# Patient Record
Sex: Female | Born: 1960 | Race: Black or African American | Hispanic: No | Marital: Married | Smoking: Never smoker
Health system: Southern US, Community
[De-identification: ages and names within clinical notes are randomized; demographics above are authoritative.]

## PROBLEM LIST (undated history)

## (undated) DIAGNOSIS — Z90710 Acquired absence of both cervix and uterus: Secondary | ICD-10-CM

## (undated) HISTORY — DX: Acquired absence of both cervix and uterus: Z90.710

---

## 1989-11-30 DIAGNOSIS — Z90721 Acquired absence of ovaries, unilateral: Secondary | ICD-10-CM

## 1991-11-24 HISTORY — PX: ABDOMINAL HYSTERECTOMY: SHX81

## 1991-11-30 DIAGNOSIS — Z90710 Acquired absence of both cervix and uterus: Secondary | ICD-10-CM

## 1991-11-30 HISTORY — DX: Acquired absence of both cervix and uterus: Z90.710

## 2017-11-26 ENCOUNTER — Encounter: Payer: Self-pay | Admitting: Family Medicine

## 2017-11-29 ENCOUNTER — Encounter: Payer: Self-pay | Admitting: Family Medicine

## 2017-11-30 ENCOUNTER — Encounter: Payer: Self-pay | Admitting: Family Medicine

## 2017-11-30 ENCOUNTER — Other Ambulatory Visit: Payer: Self-pay

## 2017-11-30 ENCOUNTER — Ambulatory Visit: Payer: Self-pay | Admitting: Family Medicine

## 2017-11-30 VITALS — BP 112/58 | HR 54 | Temp 97.7°F | Ht 66.0 in | Wt 147.0 lb

## 2017-11-30 DIAGNOSIS — Z7689 Persons encountering health services in other specified circumstances: Secondary | ICD-10-CM

## 2017-11-30 DIAGNOSIS — Z1159 Encounter for screening for other viral diseases: Secondary | ICD-10-CM

## 2017-11-30 DIAGNOSIS — Z23 Encounter for immunization: Secondary | ICD-10-CM

## 2017-11-30 DIAGNOSIS — Z1231 Encounter for screening mammogram for malignant neoplasm of breast: Secondary | ICD-10-CM

## 2017-11-30 DIAGNOSIS — R010 Benign and innocent cardiac murmurs: Secondary | ICD-10-CM | POA: Insufficient documentation

## 2017-11-30 DIAGNOSIS — Z1239 Encounter for other screening for malignant neoplasm of breast: Secondary | ICD-10-CM

## 2017-11-30 DIAGNOSIS — Z13220 Encounter for screening for lipoid disorders: Secondary | ICD-10-CM

## 2017-11-30 DIAGNOSIS — Z114 Encounter for screening for human immunodeficiency virus [HIV]: Secondary | ICD-10-CM

## 2017-11-30 NOTE — Progress Notes (Signed)
Subjective:     Patient ID: Madison Booker, female   DOB: Feb 05, 1961, 57 y.o.   MRN: [id number]  HPI New Patient: Here to establish care. She endorsed hx of functional murmur diagnosed when she was around 57 years old. She had not had issues with her heart since then. She also endorsed gas from her vagina whenever she stands from a sitting position on and off. She denies pain, no GI symptoms, no vaginal bleed. She had abdominal hysterectomy with cervix removed in 1993 and had not needed to do PAP since then. No other concern today.  No current outpatient medications on file prior to visit.   No current facility-administered medications on file prior to visit.    Past Medical History:  Diagnosis Date  . H/O abdominal hysterectomy 11/30/1991   Vitals:   11/30/17 0836  BP: (!) 112/58  Pulse: (!) 54  Temp: 97.7 F (36.5 C)  TempSrc: Oral  SpO2: 97%  Weight: 147 lb (66.7 kg)  Height: 5\' 6"  (1.676 m)     Review of Systems  Respiratory: Negative.   Cardiovascular: Negative.   Gastrointestinal: Negative.   Genitourinary:       Passing gas through her vagina  All other systems reviewed and are negative.      Objective:   Physical Exam  Constitutional: She is oriented to person, place, and time. She appears well-developed. No distress.  Cardiovascular: Normal rate, regular rhythm and normal heart sounds.  No murmur heard. Pulmonary/Chest: Effort normal and breath sounds normal. No respiratory distress. She has no wheezes.  Abdominal: Soft. Bowel sounds are normal. She exhibits no distension and no mass. There is no tenderness.  Genitourinary:  Genitourinary Comments: Deferred  Musculoskeletal: Normal range of motion. She exhibits no edema.  Neurological: She is alert and oriented to person, place, and time.  Psychiatric: She has a normal mood and affect. Her behavior is normal. Judgment and thought content normal.  Nursing note and vitals reviewed.      Assessment:      Establish care Functional murmur GU gassing    Plan:     Physical exam benign. FLP, BMET, HIV and Hep C screening test completed today. Mammogram ordered. Flu shot and Tdap given today. Colonoscopy offered but she stated she is up to date and not due till 2021. She declined GI referral.  Functional murmur. Normal Cardiovascular exam. She is asymptomatic. We will monitor.  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 This is benign. Return for GU and wellness exam in few weeks.  F/U as needed

## 2017-11-30 NOTE — Patient Instructions (Signed)
Hepatitis C Test Hepatitis C is a liver infection caused by the hepatitis C virus (HCV). Hepatitis C is usually diagnosed with two blood tests. One test checks for antibodies to the virus in your blood. Antibodies are proteins that your body makes to fight infections. If you have antibodies to HCV, it means you have been infected. It does not mean you are still infected. An HCV infection may not cause any symptoms, and you may be able to get rid of the virus without treatment. If you have antibodies to HCV, you will need to have another test to find out if you are still infected. This test is called the HCV RNA qualitative test. It looks for genetic material from HCV in your blood. If you are diagnosed with an active HCV infection, you may also have an HCV RNA quantitative test to measure the amount of virus in your blood (viral load). Your health care provider may repeat this test in order to monitor you during treatment. You may also have an HCV genotype test to identify the kind (genotype) of virus you have. This helps your health care provider determine the best treatment for you. You may be tested if you show symptoms of HCV infection. It is important to be tested because hepatitis C can lead to serious liver damage if not treated. All HCV tests require a blood sample taken from a vein in your hand or arm. What do the results mean? It is your responsibility to obtain your test results. Ask the lab or department performing the test when and how you will get your results. Talk to your health care provider if you have any questions about your test results. Results of both the HCV antibody test and the HCV RNA test will be either positive or negative. Meaning of Negative Test Results  If your HCV antibody test is negative, it may mean that you have not been infected with HCV. However, it can take a few months for the antibodies to build up in your blood. If it is possible you may have been infected  recently, you may need to repeat the test.  If your HCV RNA qualitative test is negative, this means it is unlikely you have an active HCV infection. Meaning of Positive Test Results  If your HCV antibody test is positive, it is likely that you are infected or have been infected with HCV.  If your HCV RNA qualitative test is also positive, it confirms you have an active HCV infection. Talk with your health care provider to discuss your results, treatment options, and if necessary, the need for more tests. Talk with your health care provider if you have any questions about your results. This information is not intended to replace advice given to you by your health care provider. Make sure you discuss any questions you have with your health care provider. Document Released: 12/12/2004 Document Revised: 07/15/2016 Document Reviewed: 02/12/2014 Elsevier Interactive Patient Education  [employer]2018 Elsevier Inc.

## 2017-12-01 ENCOUNTER — Telehealth: Payer: Self-pay | Admitting: Family Medicine

## 2017-12-01 LAB — BASIC METABOLIC PANEL
BUN/Creatinine Ratio: 19 (ref 9–23)
BUN: 13 mg/dL (ref 6–24)
CALCIUM: 9.3 mg/dL (ref 8.7–10.2)
CHLORIDE: 104 mmol/L (ref 96–106)
CO2: 27 mmol/L (ref 20–29)
Creatinine, Ser: 0.67 mg/dL (ref 0.57–1.00)
GFR calc non Af Amer: 99 mL/min/{1.73_m2} (ref >59)
GFR, EST AFRICAN AMERICAN: 114 mL/min/{1.73_m2} (ref >59)
Glucose: 77 mg/dL (ref 65–99)
Potassium: 4.1 mmol/L (ref 3.5–5.2)
Sodium: 143 mmol/L (ref 134–144)

## 2017-12-01 LAB — LIPID PANEL
CHOL/HDL RATIO: 2.9 ratio (ref 0.0–4.4)
Cholesterol, Total: 215 mg/dL — ABNORMAL HIGH (ref 100–199)
HDL: 73 mg/dL (ref >39)
LDL Calculated: 132 mg/dL — ABNORMAL HIGH (ref 0–99)
TRIGLYCERIDES: 49 mg/dL (ref 0–149)
VLDL Cholesterol Cal: 10 mg/dL (ref 5–40)

## 2017-12-01 LAB — HIV ANTIBODY (ROUTINE TESTING W REFLEX): HIV Screen 4th Generation wRfx: NONREACTIVE

## 2017-12-01 LAB — HEPATITIS C ANTIBODY: Hep C Virus Ab: <0.1 s/co ratio (ref 0.0–0.9)

## 2017-12-01 NOTE — Telephone Encounter (Signed)
I called patient back and discussed all result with her. I discussed lifestyle modification  with her regarding her lipid panel. All questions were answered.

## 2017-12-01 NOTE — Telephone Encounter (Signed)
HIPPA compliant call back message left.Result message not left on her phone.    Note: All results are normal except for lipid panel.  Her 10 years ASCVD risk is 1.7%. She does not need to be on Statin. Diet and exercise modification recommended.

## 2017-12-27 ENCOUNTER — Ambulatory Visit
Admission: RE | Admit: 2017-12-27 | Discharge: 2017-12-27 | Disposition: A | Discharge status: Home / Self Care | Payer: BLUE CROSS/BLUE SHIELD | Source: Ambulatory Visit | Attending: Family Medicine | Admitting: Family Medicine

## 2017-12-27 DIAGNOSIS — Z1239 Encounter for other screening for malignant neoplasm of breast: Secondary | ICD-10-CM

## 2017-12-27 IMAGING — MG DIGITAL SCREENING BILATERAL MAMMOGRAM WITH CAD
4 series · 4 of 4 positions shown · non-contrast
Comparison: No priors available.

CLINICAL DATA: Screening.

EXAM:
DIGITAL SCREENING BILATERAL MAMMOGRAM WITH CAD

[L CC]
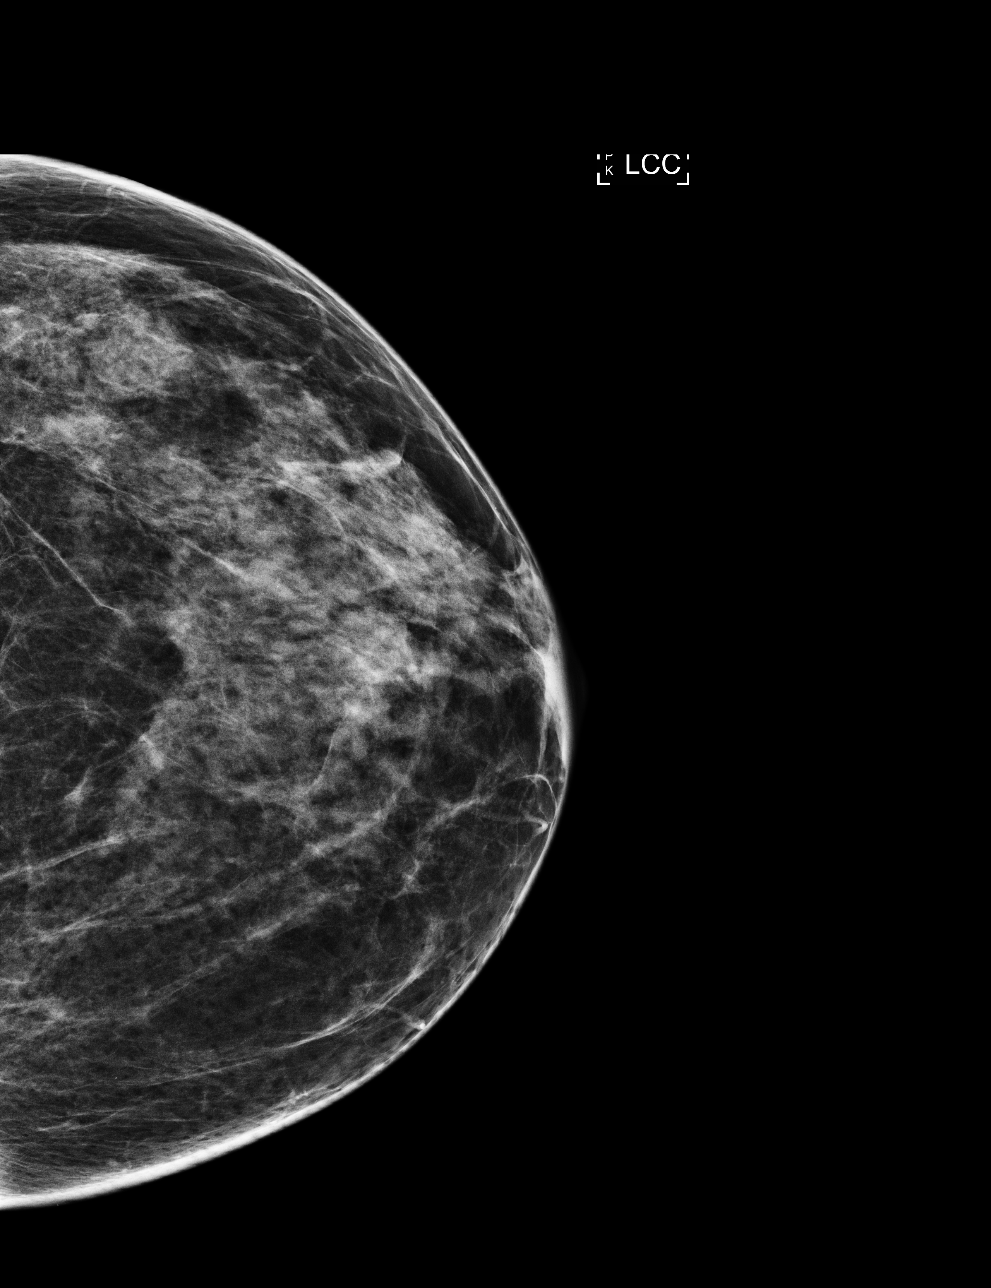

[L MLO]
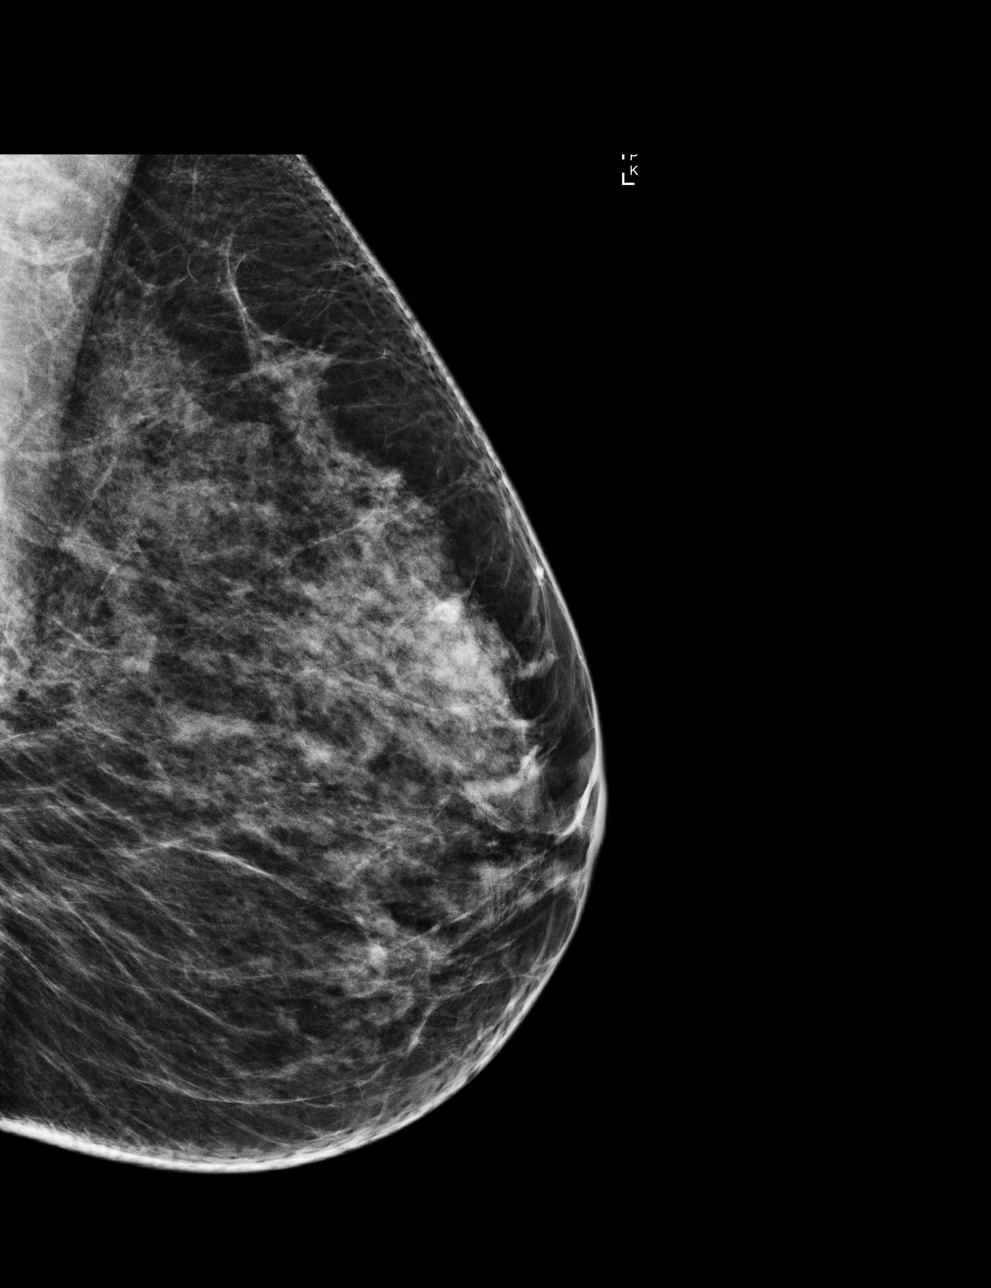

[R CC]
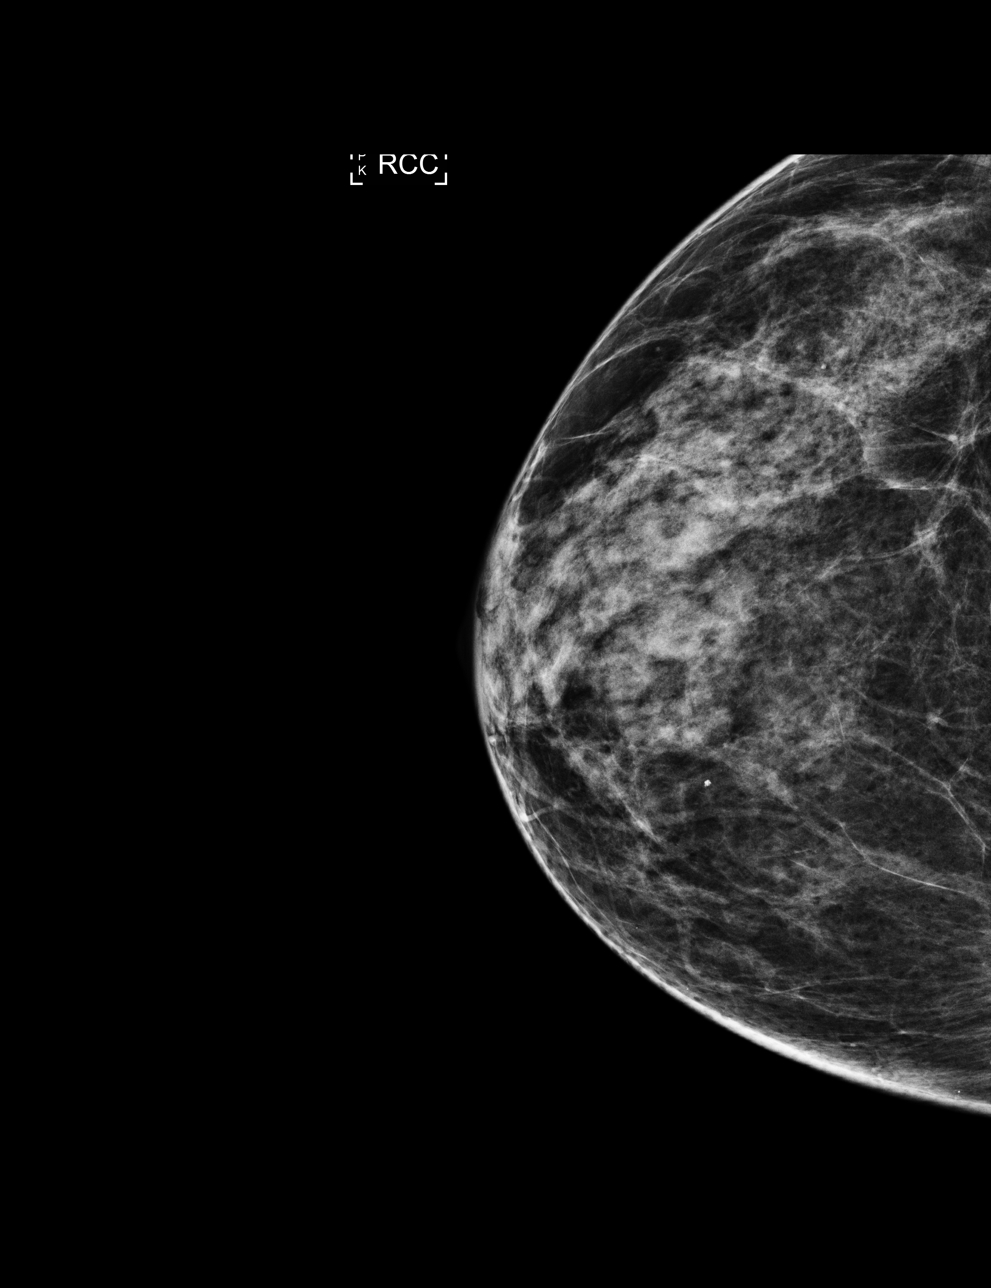

[R MLO]
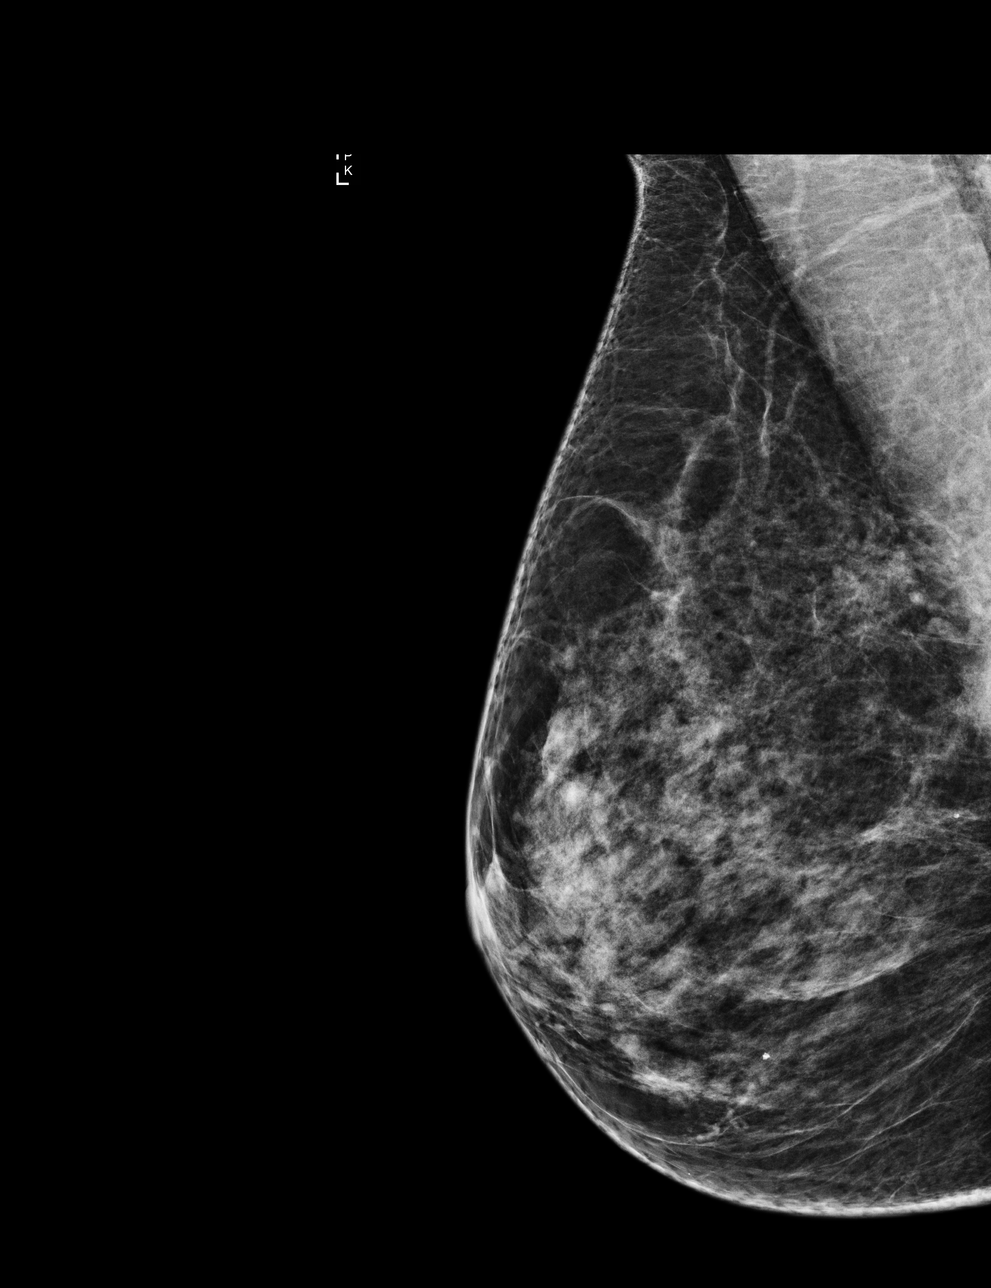

[4 of 4 positions shown; findings below may reference images not displayed]

ACR Breast Density Category c: The breast tissue is heterogeneously
dense, which may obscure small masses
FINDINGS: There are no findings suspicious for malignancy. Images were
processed with CAD.
IMPRESSION: No mammographic evidence of malignancy. A result letter of this
screening mammogram will be mailed directly to the patient.

RECOMMENDATION:
Screening mammogram in one year. (Code:[MJ])

BI-RADS CATEGORY  1: Negative.

## 2020-10-01 ENCOUNTER — Encounter: Payer: Self-pay | Admitting: Family Medicine

## 2020-10-04 LAB — FECAL OCCULT BLOOD, GUAIAC: Fecal Occult Blood: NEGATIVE

## 2020-10-25 ENCOUNTER — Encounter: Payer: Self-pay | Admitting: Family Medicine

## 2020-10-25 ENCOUNTER — Other Ambulatory Visit: Payer: Self-pay

## 2020-10-25 ENCOUNTER — Ambulatory Visit: Payer: No Typology Code available for payment source | Admitting: Family Medicine

## 2020-10-25 ENCOUNTER — Ambulatory Visit
Admission: RE | Admit: 2020-10-25 | Discharge: 2020-10-25 | Disposition: A | Discharge status: Home / Self Care | Payer: No Typology Code available for payment source | Source: Ambulatory Visit | Attending: Family Medicine | Admitting: Family Medicine

## 2020-10-25 VITALS — BP 115/60 | HR 89 | Ht 66.0 in | Wt 142.4 lb

## 2020-10-25 DIAGNOSIS — E785 Hyperlipidemia, unspecified: Secondary | ICD-10-CM

## 2020-10-25 DIAGNOSIS — Z0000 Encounter for general adult medical examination without abnormal findings: Secondary | ICD-10-CM | POA: Diagnosis not present

## 2020-10-25 DIAGNOSIS — E1169 Type 2 diabetes mellitus with other specified complication: Secondary | ICD-10-CM | POA: Diagnosis not present

## 2020-10-25 DIAGNOSIS — Z1231 Encounter for screening mammogram for malignant neoplasm of breast: Secondary | ICD-10-CM | POA: Diagnosis not present

## 2020-10-25 DIAGNOSIS — E2839 Other primary ovarian failure: Secondary | ICD-10-CM | POA: Diagnosis not present

## 2020-10-25 NOTE — Progress Notes (Signed)
Subjective:     Madison Booker is a 59 y.o. female and is here for a comprehensive physical exam. The patient reports no problems.  Social History   Socioeconomic History  . Marital status: Unknown    Spouse name: Not on file  . Number of children: Not on file  . Years of education: Not on file  . Highest education level: Not on file  Occupational History  . Not on file  Tobacco Use  . Smoking status: Never Smoker  . Smokeless tobacco: Never Used  Vaping Use  . Vaping Use: Never used  Substance and Sexual Activity  . Alcohol use: No  . Drug use: No  . Sexual activity: Not on file  Other Topics Concern  . Not on file  Social History Narrative  . Not on file   Social Determinants of Health   Financial Resource Strain:   . Difficulty of Paying Living Expenses: Not on file  Food Insecurity:   . Worried About Programme researcher, broadcasting/film/video in the Last Year: Not on file  . Ran Out of Food in the Last Year: Not on file  Transportation Needs:   . Lack of Transportation (Medical): Not on file  . Lack of Transportation (Non-Medical): Not on file  Physical Activity:   . Days of Exercise per Week: Not on file  . Minutes of Exercise per Session: Not on file  Stress:   . Feeling of Stress : Not on file  Social Connections:   . Frequency of Communication with Friends and Family: Not on file  . Frequency of Social Gatherings with Friends and Family: Not on file  . Attends Religious Services: Not on file  . Active Member of Clubs or Organizations: Not on file  . Attends Banker Meetings: Not on file  . Marital Status: Not on file  Intimate Partner Violence:   . Fear of Current or Ex-Partner: Not on file  . Emotionally Abused: Not on file  . Physically Abused: Not on file  . Sexually Abused: Not on file   Health Maintenance  Topic Date Due  . DEXA SCAN  Never done  . MAMMOGRAM  12/27/2018  . COLONOSCOPY  12/01/2019  . TETANUS/TDAP  12/01/2027  . INFLUENZA VACCINE   Completed  . Hepatitis C Screening  Completed  . HIV Screening  Completed    The following portions of the patient's history were reviewed and updated as appropriate: allergies, current medications, past family history, past medical history, past social history, past surgical history and problem list.  Review of Systems Pertinent items noted in HPI and remainder of comprehensive ROS otherwise negative.   Objective:    BP 115/60   Pulse 89   Ht 5\' 6"  (1.676 m)   Wt 142 lb 6.4 oz (64.6 kg)   SpO2 97%   BMI 22.98 kg/m  General appearance: alert and cooperative Head: Normocephalic, without obvious abnormality, atraumatic Eyes: conjunctivae/corneas clear. PERRL, EOM's intact. Fundi benign. Ears: normal TM's and external ear canals both ears Throat: lips, mucosa, and tongue normal; teeth and gums normal Lungs: clear to auscultation bilaterally Heart: regular rate and rhythm, S1, S2 normal, no murmur, click, rub or gallop Abdomen: soft, non-tender; bowel sounds normal; no masses,  no organomegaly Extremities: extremities normal, atraumatic, no cyanosis or edema Skin: Skin color, texture, turgor normal. No rashes or lesions Lymph nodes: Cervical, supraclavicular, and axillary nodes normal. Neurologic: Alert and oriented X 3, normal strength and tone. Normal symmetric reflexes. Normal  coordination and gait    Assessment:    Healthy female exam.   Plan:  Normal well exam. She is up to date with her COVID-19 and Flu shot. See media for COVID-19 vaccination. She also recently got her booster shot. Colon cancer screening discussed. She had FOBT test done in Nov. She brought report in later today and I have updated her record. She will like to defer colonoscopy for now. Bmte and FLP checked today. Mammogram and Dexa discussed. She will schedule her appointment. Order placed.   See After Visit Summary for Counseling Recommendations

## 2020-10-25 NOTE — Patient Instructions (Signed)
Mammogram °A mammogram is an X-ray of the breasts that is done to check for changes that are not normal. This test can screen for and find any changes that may suggest breast cancer. Mammograms are regularly done on women. A man may have a mammogram if he has a lump or swelling in his breast. This test can also help to find other changes and variations in the breast. °Tell a doctor: °· About any allergies you have. °· If you have breast implants. °· If you have had previous breast disease, biopsy, or surgery. °· If you are breastfeeding. °· If you are younger than age 25. °· If you have a family history of breast cancer. °· Whether you are pregnant or may be pregnant. °What are the risks? °Generally, this is a safe procedure. However, problems may occur, including: °· Exposure to radiation. Radiation levels are very low with this test. °· The results being misinterpreted. °· The need for further tests. °· The inability of the mammogram to detect certain cancers. °What happens before the procedure? °· Have this test done about 1-2 weeks after your period. This is usually when your breasts are the least tender. °· If you are visiting a new doctor or clinic, send any past mammogram images to your new doctor's office. °· Wash your breasts and under your arms the day of the test. °· Do not use deodorants, perfumes, lotions, or powders on the day of the test. °· Take off any jewelry from your neck. °· Wear clothes that you can change into and out of easily. °What happens during the procedure? ° °· You will undress from the waist up. You will put on a gown. °· You will stand in front of the X-ray machine. °· Each breast will be placed between two plastic or glass plates. The plates will press down on your breast for a few seconds. Try to stay as relaxed as possible. This does not cause any harm to your breasts. Any discomfort you feel will be very brief. °· X-rays will be taken from different angles of each breast. °The  procedure may vary among doctors and hospitals. °What happens after the procedure? °· The mammogram will be read by a specialist (radiologist). °· You may need to do certain parts of the test again. This depends on the quality of the images. °· Ask when your test results will be ready. Make sure you get your test results. °· You may go back to your normal activities. °Summary °· A mammogram is a low energy X-ray of the breasts that is done to check for abnormal changes. A man may have this test if he has a lump or swelling in his breast. °· Before the procedure, tell your doctor about any breast problems that you have had in the past. °· Have this test done about 1-2 weeks after your period. °· For the test, each breast will be placed between two plastic or glass plates. The plates will press down on your breast for a few seconds. °· The mammogram will be read by a specialist (radiologist). Ask when your test results will be ready. Make sure you get your test results. °This information is not intended to replace advice given to you by your health care provider. Make sure you discuss any questions you have with your health care provider. °Document Revised: 06/30/2018 Document Reviewed: 06/30/2018 °Elsevier Patient Education © 2020 Elsevier Inc. ° °

## 2020-10-26 LAB — LIPID PANEL
Chol/HDL Ratio: 2.4 ratio (ref 0.0–4.4)
Cholesterol, Total: 230 mg/dL — ABNORMAL HIGH (ref 100–199)
HDL: 94 mg/dL (ref >39)
LDL Chol Calc (NIH): 129 mg/dL — ABNORMAL HIGH (ref 0–99)
Triglycerides: 39 mg/dL (ref 0–149)
VLDL Cholesterol Cal: 7 mg/dL (ref 5–40)

## 2020-10-26 LAB — BASIC METABOLIC PANEL
BUN/Creatinine Ratio: 23 (ref 9–23)
BUN: 14 mg/dL (ref 6–24)
CO2: 27 mmol/L (ref 20–29)
Calcium: 9.4 mg/dL (ref 8.7–10.2)
Chloride: 103 mmol/L (ref 96–106)
Creatinine, Ser: 0.62 mg/dL (ref 0.57–1.00)
GFR calc Af Amer: 116 mL/min/{1.73_m2} (ref >59)
GFR calc non Af Amer: 100 mL/min/{1.73_m2} (ref >59)
Glucose: 87 mg/dL (ref 65–99)
Potassium: 4 mmol/L (ref 3.5–5.2)
Sodium: 142 mmol/L (ref 134–144)

## 2020-10-28 ENCOUNTER — Telehealth: Payer: Self-pay | Admitting: Family Medicine

## 2020-10-28 NOTE — Telephone Encounter (Signed)
HIPAA compliant callback message left.   Please advise her when she calls:  Lipid profile elevated from her previous. However, her risk of cardiovascular event in the next 10 years is low (2.2%).  Hence, I will recommend lifestyle modification, I.e reduce fat, and carbs, increase fibers, fruits and vege. Increase exercise as tolerated.  F/U in 1 yr for recheck.  Other tests BMP are normal.

## 2020-10-28 NOTE — Telephone Encounter (Signed)
Patient returns call to nurse line for test results. Provided below information. Patient has no additional questions.   Veronda Prude, RN

## 2021-03-24 ENCOUNTER — Other Ambulatory Visit: Payer: No Typology Code available for payment source

## 2021-09-26 ENCOUNTER — Other Ambulatory Visit: Payer: Self-pay | Admitting: Family Medicine

## 2021-09-26 DIAGNOSIS — Z1231 Encounter for screening mammogram for malignant neoplasm of breast: Secondary | ICD-10-CM

## 2021-10-31 ENCOUNTER — Ambulatory Visit
Admission: RE | Admit: 2021-10-31 | Discharge: 2021-10-31 | Disposition: A | Discharge status: Home / Self Care | Payer: 59 | Source: Ambulatory Visit | Attending: Family Medicine | Admitting: Family Medicine

## 2021-10-31 DIAGNOSIS — Z1231 Encounter for screening mammogram for malignant neoplasm of breast: Secondary | ICD-10-CM

## 2021-10-31 IMAGING — MG MM DIGITAL SCREENING BILAT W/ TOMO AND CAD
6 of 10 series · 6 of 30 positions shown · non-contrast
Comparison: Previous exam(s).

CLINICAL DATA: Screening.

EXAM:
DIGITAL SCREENING BILATERAL MAMMOGRAM WITH TOMOSYNTHESIS AND CAD
TECHNIQUE: Bilateral screening digital craniocaudal and mediolateral oblique
mammograms were obtained. Bilateral screening digital breast
tomosynthesis was performed. The images were evaluated with
computer-aided detection.

[L CC synth-2D (1 of 2)]
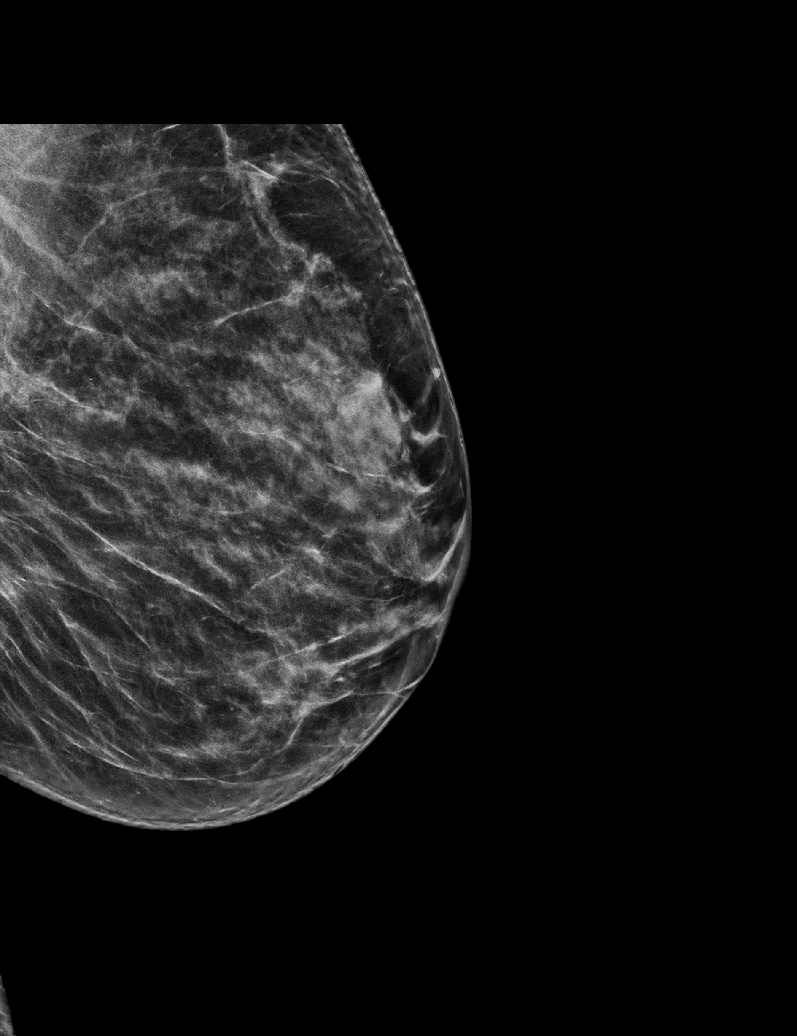

[L CC synth-2D (2 of 2)]
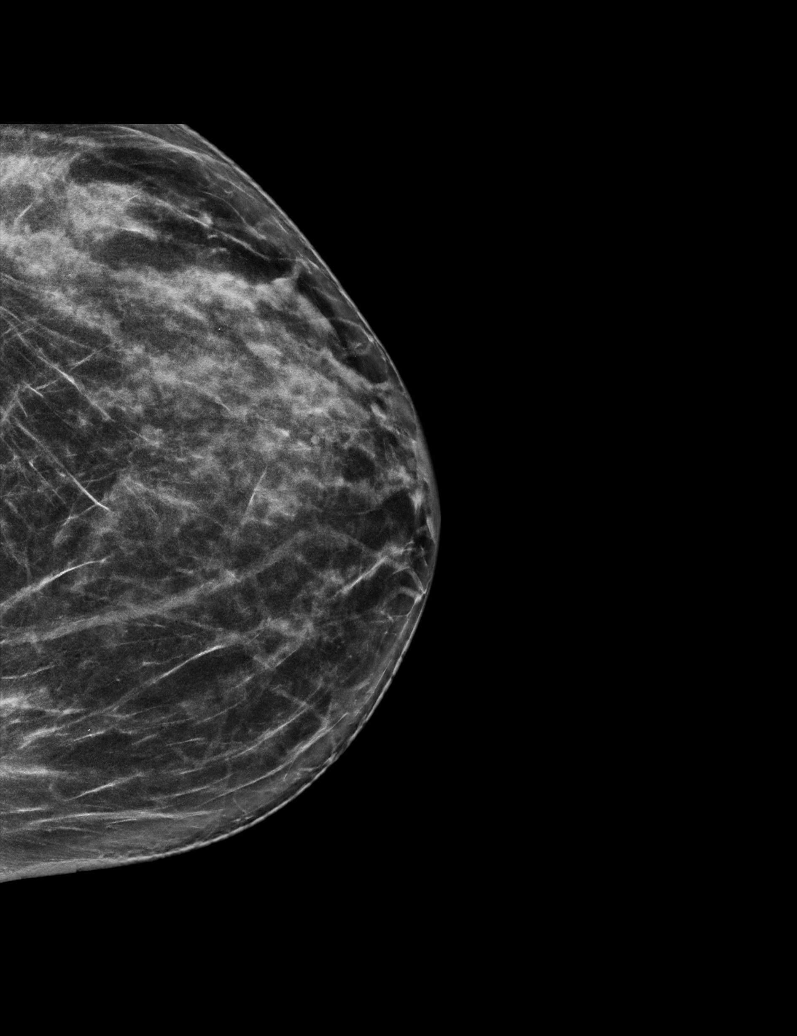

[L MLO synth-2D]
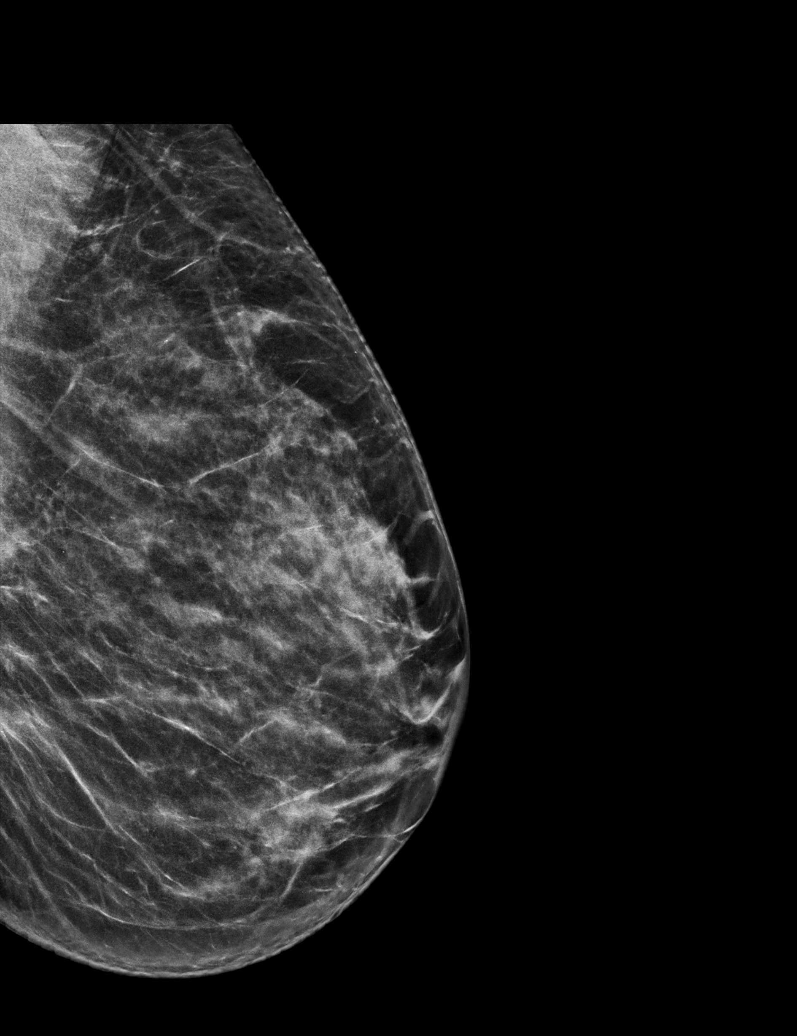

[R CC synth-2D]
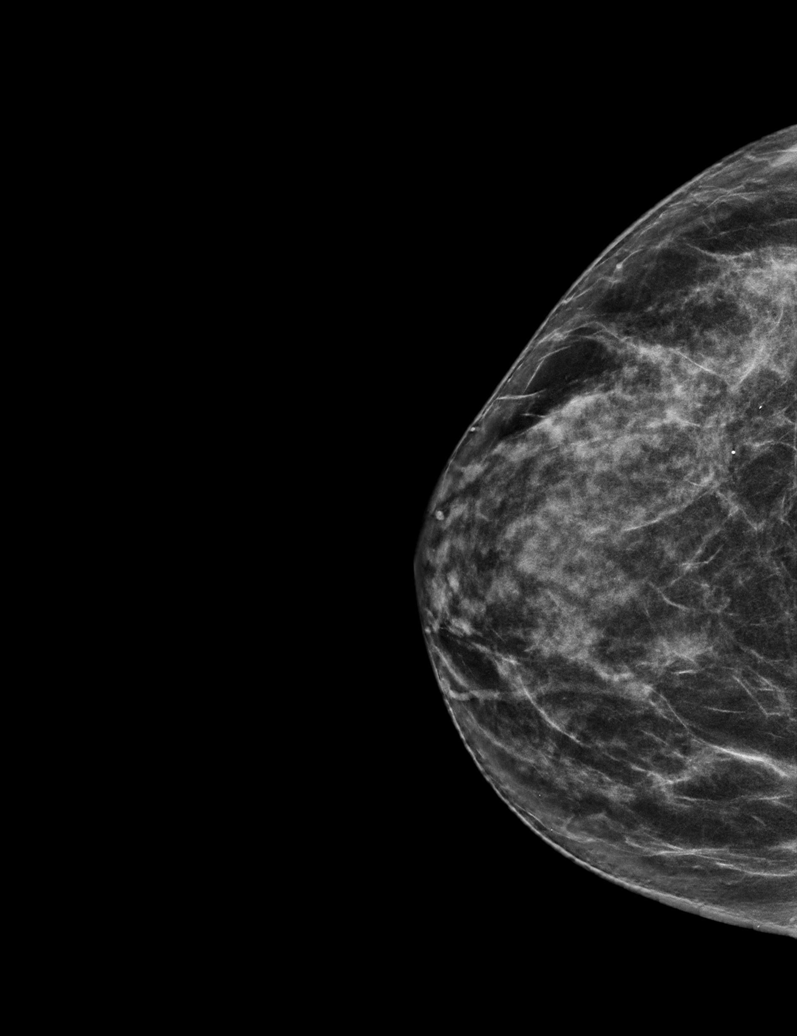

[R MLO synth-2D]
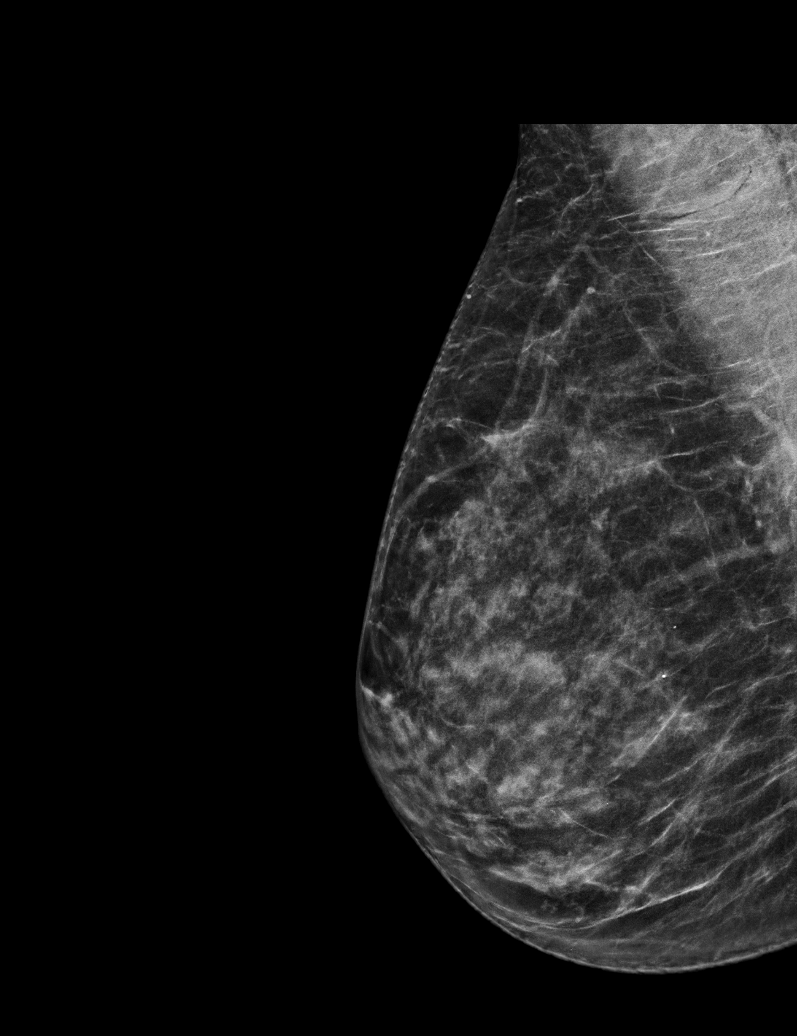

[L MLO tomo · tomo slice 31/62.0]
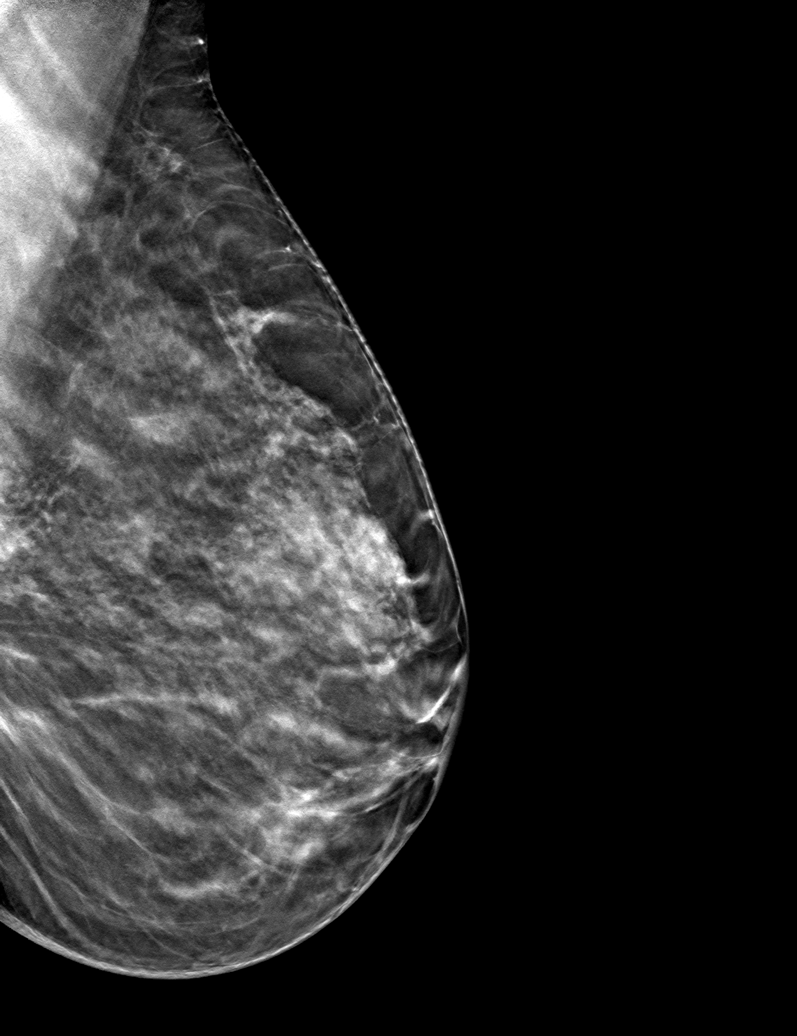

[6 of 30 positions shown; findings below may reference images not displayed]

ACR Breast Density Category c: The breast tissue is heterogeneously
dense, which may obscure small masses.
FINDINGS: There are no findings suspicious for malignancy.
IMPRESSION: No mammographic evidence of malignancy. A result letter of this
screening mammogram will be mailed directly to the patient.

RECOMMENDATION:
Screening mammogram in one year. (Code:[V2])

BI-RADS CATEGORY  1: Negative.

## 2021-11-03 ENCOUNTER — Ambulatory Visit: Payer: No Typology Code available for payment source

## 2021-11-05 NOTE — Telephone Encounter (Signed)
Attempted to reach pt. No answer. LVM to call the office to make appt for physical exam. Will try later today. Aquilla Solian, CMA

## 2021-11-10 ENCOUNTER — Encounter: Payer: Self-pay | Admitting: Family Medicine

## 2021-11-10 DIAGNOSIS — E785 Hyperlipidemia, unspecified: Secondary | ICD-10-CM | POA: Insufficient documentation

## 2021-11-11 ENCOUNTER — Encounter: Payer: Self-pay | Admitting: Family Medicine

## 2021-11-11 ENCOUNTER — Ambulatory Visit (INDEPENDENT_AMBULATORY_CARE_PROVIDER_SITE_OTHER): Payer: 59 | Admitting: Family Medicine

## 2021-11-11 ENCOUNTER — Other Ambulatory Visit: Payer: Self-pay

## 2021-11-11 VITALS — BP 106/52 | HR 60 | Ht 61.0 in | Wt 151.5 lb

## 2021-11-11 DIAGNOSIS — Z0000 Encounter for general adult medical examination without abnormal findings: Secondary | ICD-10-CM

## 2021-11-11 DIAGNOSIS — E785 Hyperlipidemia, unspecified: Secondary | ICD-10-CM | POA: Diagnosis not present

## 2021-11-11 DIAGNOSIS — Z1211 Encounter for screening for malignant neoplasm of colon: Secondary | ICD-10-CM | POA: Diagnosis not present

## 2021-11-11 MED ORDER — ZOSTER VAC RECOMB ADJUVANTED 50 MCG/0.5ML IM SUSR: 0.5000 mL | Freq: Once | INTRAMUSCULAR | 1 refills | Status: AC

## 2021-11-11 NOTE — Addendum Note (Signed)
Addended by: Janit Pagan T on: 11/11/2021 02:20 PM   Modules accepted: Orders

## 2021-11-11 NOTE — Patient Instructions (Signed)
Colorectal Cancer Screening °Colorectal cancer screening is a group of tests that are used to check for colorectal cancer before symptoms develop. Colorectal refers to the colon and rectum. The colon and rectum are located at the end of the digestive tract and carry stool (feces) out of the body. °Who should have screening? °All adults who are 45-59 years old should have screening. Your health care provider may recommend screening before age 45. You will have tests every 1-10 years, depending on your results and the type of screening test. Screening recommendations for adults who are 76-85 years old vary depending on a person's health. People older than age 85 should no longer get colorectal cancer screening. °You may have screening tests starting before age 45, or more often than other people, if you have any of these risk factors: °A personal or family history of colorectal cancer or abnormal growths known as polyps in your colon. °Inflammatory bowel disease, such as ulcerative colitis or Crohn's disease. °A history of having radiation treatment to the abdomen or the area between the hip bones (pelvic area) for cancer. °A type of genetic syndrome that is passed from parent to child (hereditary), such as: °Lynch syndrome. °Familial adenomatous polyposis. °Turcot syndrome. °Peutz-Jeghers syndrome. °MUTYH-associated polyposis (MAP). °A personal history of diabetes. °Types of tests °There are several types of colorectal screening tests. You may have one or more of the following: °Guaiac-based fecal occult blood testing. For this test, a stool sample is checked for hidden (occult) blood, which could be a sign of colorectal cancer. °Fecal immunochemical test (FIT). For this test, a stool sample is checked for blood, which could be a sign of colorectal cancer. °Stool DNA test. For this test, a stool sample is checked for blood and changes in DNA that could lead to colorectal cancer. °Sigmoidoscopy. During this test, a  thin, flexible tube with a camera on the end, called a sigmoidoscope, is used to examine the rectum and the lower colon. °Colonoscopy. During this test, a long, flexible tube with a camera on the end, called a colonoscope, is used to examine the entire colon and rectum. Also, sometimes a tissue sample is taken to be looked at under a microscope (biopsy) or small polyps are removed during this test. °Virtual colonoscopy. Instead of a colonoscope, this type of colonoscopy uses a CT scan to take pictures of the colon and rectum. A CT scan is a type of X-ray that is made using computers. °What are the benefits of screening? °Screening reduces your risk for colorectal cancer and can help identify cancer at an early stage, when the cancer can be removed or treated more easily. It is common for polyps to form in the lining of the colon, especially as you age. These polyps may be cancerous or become cancerous over time. Screening can identify these polyps. °What are the risks of screening? °Generally, these are safe tests. However, problems may occur, including: °The need for more tests to confirm results from a stool sample test. Stool sample tests have fewer risks than other types of screening tests. °Being exposed to low levels of radiation, if you had a test involving X-rays. This may slightly increase your cancer risk. The benefit of detecting cancer outweighs the slight increase in risk. °Bleeding, damage to the intestine, or infection caused by a sigmoidoscopy or colonoscopy. °A reaction to medicines given during a sigmoidoscopy or colonoscopy. °Talk with your health care provider to understand your risk for colorectal cancer and to make a screening   plan that is right for you. Questions to ask your health care provider When should I start colorectal cancer screening? What is my risk for colorectal cancer? How often do I need screening? Which screening tests do I need? How do I get my test results? What do my  results mean? Where to find more information Learn more about colorectal cancer screening from: The American Cancer Society: cancer.org National Cancer Institute: cancer.gov Summary Colorectal cancer screening is a group of tests used to check for colorectal cancer before symptoms develop. All adults who are 48-21 years old should have screening. Your health care provider may recommend screening before age 97. You may have screening tests starting before age 78, or more often than other people, if you have certain risk factors. Screening reduces your risk for colorectal cancer and can help identify cancer at an early stage, when the cancer can be removed or treated more easily. Talk with your health care provider to understand your risk for colorectal cancer and to make a screening plan that is right for you. This information is not intended to replace advice given to you by your health care provider. Make sure you discuss any questions you have with your health care provider. Document Revised: 02/28/2020 Document Reviewed: 02/28/2020 Elsevier Patient Education  2022 [employer].

## 2021-11-11 NOTE — Progress Notes (Signed)
Subjective:     Madison Booker is a 60 y.o. female and is here for a comprehensive physical exam. The patient reports no problems. She endorses COVID-19 and flu vaccination in Oct 2022 and is here with her covid19 card.  Social History   Socioeconomic History   Marital status: Unknown    Spouse name: Not on file   Number of children: Not on file   Years of education: Not on file   Highest education level: Not on file  Occupational History   Not on file  Tobacco Use   Smoking status: Never   Smokeless tobacco: Never  Vaping Use   Vaping Use: Never used  Substance and Sexual Activity   Alcohol use: No   Drug use: No   Sexual activity: Not on file  Other Topics Concern   Not on file  Social History Narrative   Not on file   Social Determinants of Health   Financial Resource Strain: Not on file  Food Insecurity: Not on file  Transportation Needs: Not on file  Physical Activity: Not on file  Stress: Not on file  Social Connections: Not on file  Intimate Partner Violence: Not on file   Health Maintenance  Topic Date Due   DEXA SCAN  Never done   Pneumococcal Vaccine 18-69 Years old (1 - PCV) Never done   Zoster Vaccines- Shingrix (1 of 2) Never done   COLONOSCOPY (Pts 45-67yrs Insurance coverage will need to be confirmed)  12/01/2019   COVID-19 Vaccine (3 - Booster for Moderna series) 11/13/2020   MAMMOGRAM  10/31/2022   TETANUS/TDAP  12/01/2027   INFLUENZA VACCINE  Completed   Hepatitis C Screening  Completed   HIV Screening  Completed   HPV VACCINES  Aged Out   COLON CANCER SCREENING ANNUAL FOBT  Discontinued    The following portions of the patient's history were reviewed and updated as appropriate: allergies, current medications, past family history, past medical history, past social history, past surgical history, and problem list.       Review of Systems Pertinent items noted in HPI and remainder of comprehensive ROS otherwise negative.    Objective:    BP (!) 106/52    Pulse 60    Ht 5\' 1"  (1.549 m)    Wt 151 lb 8 oz (68.7 kg)    SpO2 100%    BMI 28.63 kg/m  General appearance: alert and cooperative Head: Normocephalic, without obvious abnormality, atraumatic Eyes: conjunctivae/corneas clear. PERRL, EOM's intact. Fundi benign. Ears: Soft cerument impaction of her right ear, otherwise, normal ear exam. Throat: lips, mucosa, and tongue normal; teeth and gums normal Lungs: clear to auscultation bilaterally Heart: regular rate and rhythm, S1, S2 normal, no murmur, click, rub or gallop Abdomen: soft, non-tender; bowel sounds normal; no masses,  no organomegaly Extremities: extremities normal, atraumatic, no cyanosis or edema Skin: Skin color, texture, turgor normal. No rashes or lesions Lymph nodes: Cervical, supraclavicular, and axillary nodes normal. Neurologic: Alert and oriented X 3, normal strength and tone. Normal symmetric reflexes. Normal coordination and gait    Assessment:    Healthy female exam.    Plan:    No acute/abnormal findings. She is up to date with her breast cancer screen. PAP not required due to hysterectomy status. She is up to date with her COVID 19 and Influenza vaccination. Shingrix discussed and I gave her the script to get vaccinated at her pharmacy. Colon cancer screen discussed. She does not wish to get  colonoscopy done. However, she is amendable to getting cologuard screen. Test ordered. I will contact her soon with the result. FLP and Bmet checked for HLD. F/U in 1 yr for physicals.  See After Visit Summary for Counseling Recommendations

## 2021-11-11 NOTE — Telephone Encounter (Signed)
Patient had appt today 12/20 with Dr. Lum Babe.

## 2021-11-12 ENCOUNTER — Telehealth: Payer: Self-pay | Admitting: Family Medicine

## 2021-11-12 LAB — LIPID PANEL
Chol/HDL Ratio: 3 ratio (ref 0.0–4.4)
Cholesterol, Total: 252 mg/dL — ABNORMAL HIGH (ref 100–199)
HDL: 84 mg/dL (ref >39)
LDL Chol Calc (NIH): 161 mg/dL — ABNORMAL HIGH (ref 0–99)
Triglycerides: 47 mg/dL (ref 0–149)
VLDL Cholesterol Cal: 7 mg/dL (ref 5–40)

## 2021-11-12 LAB — BASIC METABOLIC PANEL
BUN/Creatinine Ratio: 24 — ABNORMAL HIGH (ref 9–23)
BUN: 18 mg/dL (ref 6–24)
CO2: 26 mmol/L (ref 20–29)
Calcium: 9.2 mg/dL (ref 8.7–10.2)
Chloride: 104 mmol/L (ref 96–106)
Creatinine, Ser: 0.75 mg/dL (ref 0.57–1.00)
Glucose: 74 mg/dL (ref 70–99)
Potassium: 4.1 mmol/L (ref 3.5–5.2)
Sodium: 142 mmol/L (ref 134–144)
eGFR: 92 mL/min/{1.73_m2} (ref >59)

## 2021-11-12 NOTE — Telephone Encounter (Signed)
Result discussed. 10 yr ASCVD risk of 4.9% based on her lipid profile. Lifestyle modification discussed. Repeat lab in 1 yr. All questions were answered.

## 2022-01-30 ENCOUNTER — Telehealth: Payer: Self-pay

## 2022-01-30 NOTE — Telephone Encounter (Signed)
Patient calls nurse line regarding order for breast ultrasound.  ? ?Patient states that she read that the CDC was changing recommendations for women over 40 with dense breast tissue. They are recommending breast ultrasound in addition to screening mammogram.  ? ?Will forward to PCP for further advisement.  ? ?Veronda Prude, RN ? ?

## 2022-01-30 NOTE — Telephone Encounter (Signed)
Spoke with patient made appt for 3/24 at 9:30. Madison Booker, CMA ? ?

## 2022-02-05 ENCOUNTER — Encounter: Payer: Self-pay | Admitting: Family Medicine

## 2022-02-05 LAB — COLOGUARD: COLOGUARD: NEGATIVE

## 2022-02-13 ENCOUNTER — Ambulatory Visit (INDEPENDENT_AMBULATORY_CARE_PROVIDER_SITE_OTHER): Payer: 59 | Admitting: Family Medicine

## 2022-02-13 ENCOUNTER — Encounter: Payer: Self-pay | Admitting: Family Medicine

## 2022-02-13 ENCOUNTER — Other Ambulatory Visit: Payer: Self-pay

## 2022-02-13 VITALS — BP 110/58 | HR 66 | Ht 61.0 in | Wt 150.2 lb

## 2022-02-13 DIAGNOSIS — Z9189 Other specified personal risk factors, not elsewhere classified: Secondary | ICD-10-CM | POA: Diagnosis not present

## 2022-02-13 DIAGNOSIS — Z1239 Encounter for other screening for malignant neoplasm of breast: Secondary | ICD-10-CM

## 2022-02-13 NOTE — Progress Notes (Signed)
? ? ?  SUBJECTIVE:  ? ?CHIEF COMPLAINT / HPI:  ? ?Breast concern: ?She is here to discuss breast cancer screening. She had a recent normal mammogram and no previous hx of cancer. However, she worries because she has a strong family hx of cancer. ?Her maternal aunt passed away from breast cancer at age 60, and her niece from her mother's side died of breast cancer at age 65. Her brother passed a way last year from gastric cancer at age 31, and her father of prostate cancer at age 12. She requested a referral for a breast MRI. Currently denies breast symptoms or concerns.  ? ?PERTINENT  PMH / PSH: PMHx reviewed ? ?OBJECTIVE:  ? ?BP (!) 110/58   Pulse 66   Ht $R'5\' 1"'vz$  (1.549 m)   Wt 150 lb 3.2 oz (68.1 kg)   SpO2 99%   BMI 28.38 kg/m?   ?Physical Exam ?Vitals and nursing note reviewed.  ?Cardiovascular:  ?   Rate and Rhythm: Normal rate and regular rhythm.  ?   Heart sounds: Normal heart sounds. No murmur heard. ?Pulmonary:  ?   Effort: Pulmonary effort is normal. No respiratory distress.  ?   Breath sounds: Normal breath sounds. No wheezing or rhonchi.  ? ? ? ?ASSESSMENT/PLAN:  ? ?At high risk for breast cancer ?High risk based on family hx. ?No recent + BRCA report. ?Counseling done regarding cost and that insurance might not cover breast MRI. ?She is willing to proceed with testing. ?Breast MRI ordered. ?She will contact the breast imaging center for an appointment. ?  ? ?Madison Mews, MD ?[place]  ? ?

## 2022-02-13 NOTE — Patient Instructions (Signed)
Mammogram ?A mammogram is an X-ray of the breasts. This is done to check for changes that are not normal. This test can look for changes that may be caused by breast cancer or other problems. ?Mammograms are regularly done on women beginning at age 60. A man may have a mammogram if he has a lump or swelling in his breast. ?Tell a doctor: ?About any allergies you have. ?If you have breast implants. ?If you have had breast disease, biopsy, or surgery. ?If you have a family history of breast cancer. ?If you are breastfeeding. ?Whether you are pregnant or may be pregnant. ?What are the risks? ?Generally, this is a safe procedure. But problems may occur, including: ?Being exposed to radiation. Radiation levels are very low with this test. ?The need for more tests. ?The results were not read properly. ?Trouble finding breast cancer in women with dense breasts. ?What happens before the test? ?Have this test done about 1-2 weeks after your menstrual period. This is often when your breasts are the least tender. ?If you are visiting a new doctor or clinic, have any past mammogram images sent to your new doctor's office. ?Wash your breasts and under your arms on the day of the test. ?Do not use deodorants, perfumes, lotions, or powders on the day of the test. ?Take off any jewelry from your neck. ?Wear clothes that you can change into and out of easily. ?What happens during the test? ? ?You will take off your clothes from the waist up. You will put on a gown. ?You will stand in front of the X-ray machine. ?Each breast will be placed between two plastic or glass plates. The plates will press down on your breast for a few seconds. Try to relax. This does not cause any harm to your breasts. It may not feel comfortable, but it will be very brief. ?X-rays will be taken from different angles of each breast. ?The procedure may vary among doctors and hospitals. ?What can I expect after the test? ?The mammogram will be read by a  specialist (radiologist). ?You may need to do parts of the test again. This depends on the quality of the images. ?You may go back to your normal activities. ?It is up to you to get the results of your test. Ask how to get your results when they are ready. ?Summary ?A mammogram is an X-ray of the breasts. It looks for changes that may be caused by breast cancer or other problems. ?A man may have this test if he has a lump or swelling in his breast. ?Before the test, tell your doctor about any breast problems that you have had in the past. ?Have this test done about 1-2 weeks after your menstrual period. ?Ask when your test results will be ready. Make sure you get your test results. ?This information is not intended to replace advice given to you by your health care provider. Make sure you discuss any questions you have with your health care provider. ?Document Revised: 07/23/2021 Document Reviewed: 09/09/2020 ?Elsevier Patient Education ? 2022 [employer]vier [employer] ? ?

## 2022-02-13 NOTE — Assessment & Plan Note (Signed)
High risk based on family hx. ?No recent + BRCA report. ?Counseling done regarding cost and that insurance might not cover breast MRI. ?She is willing to proceed with testing. ?Breast MRI ordered. ?She will contact the breast imaging center for an appointment. ?

## 2022-03-05 ENCOUNTER — Ambulatory Visit
Admission: RE | Admit: 2022-03-05 | Discharge: 2022-03-05 | Disposition: A | Discharge status: Home / Self Care | Payer: 59 | Source: Ambulatory Visit | Attending: Family Medicine | Admitting: Family Medicine

## 2022-03-05 DIAGNOSIS — Z1239 Encounter for other screening for malignant neoplasm of breast: Secondary | ICD-10-CM

## 2022-03-05 IMAGING — MR MR BREAST WO/W CM  BILAT
4 of 5 series · 29 of 48 positions shown · IV contrast (7 ML GADAVIST)
Comparison: Prior mammograms.

CLINICAL DATA: Abbreviated Breast MRI for breast cancer screening.
Supplemental screening. Family history with breast carcinoma
diagnosed in a niece at 17 and an aunt at 61. Previous left breast
benign biopsy in [O9].

EXAM:
BILATERAL ABBREVIATED BREAST MRI WITH AND WITHOUT CONTRAST
TECHNIQUE: Multiplanar, multisequence MR images of both breasts were obtained
prior to and following the intravenous administration of 7 ml of
Gadavist

[Series 2: t2_tirm_tra ipat (a-p) · axial · 3.0mm · 0.66mm/px · z∈[-81,+81]mm · 5 of 55 slices shown]
[im 1/55]
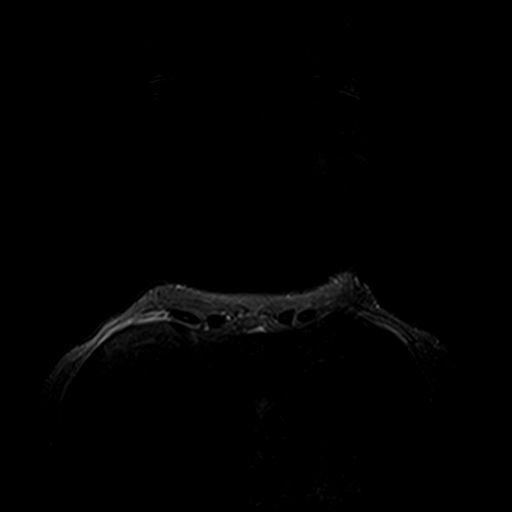
[im 14/55]
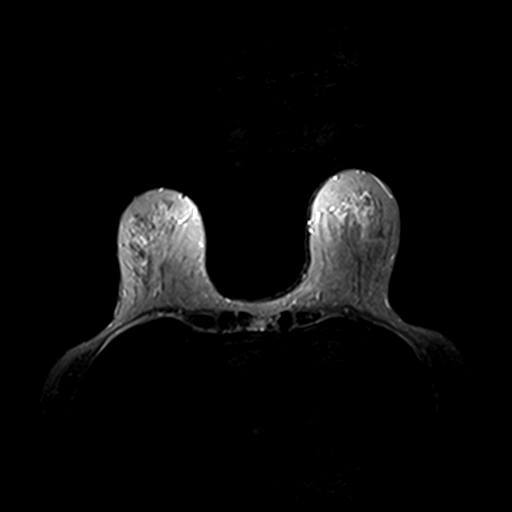
[im 28/55]
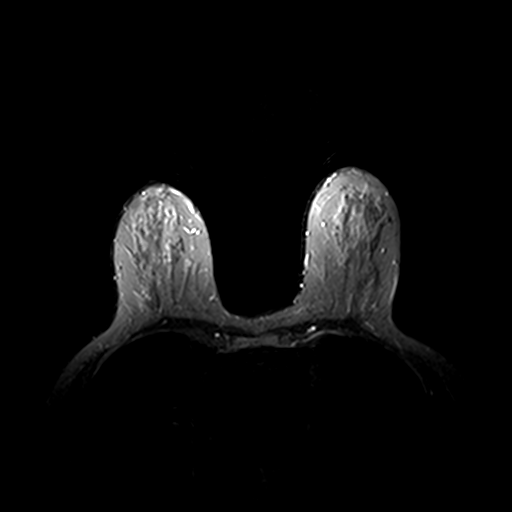
[im 41/55]
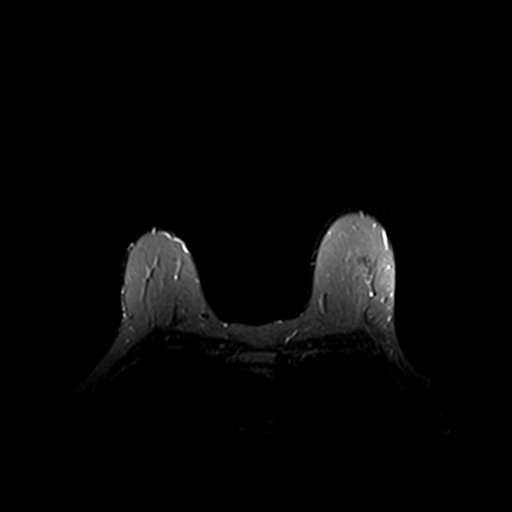
[im 55/55]
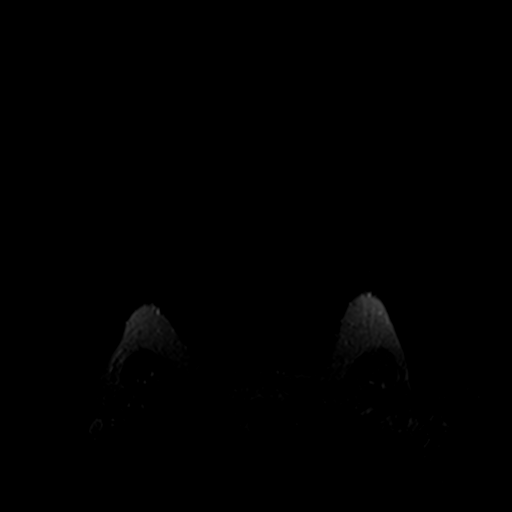

[Series 3: fl3d pre-cm · axial · non-contrast · 1.2mm · 0.89mm/px · z∈[-86,+86]mm · 8 of 144 slices shown]
[im 1/144]
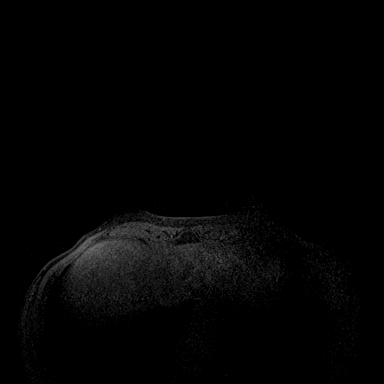
[im 23/144]
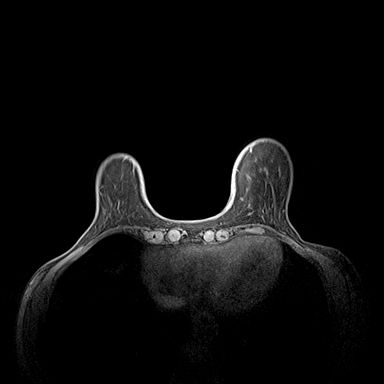
[im 45/144]
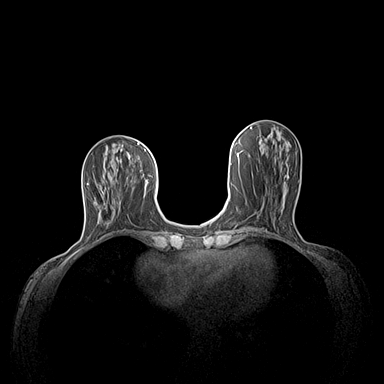
[im 67/144]
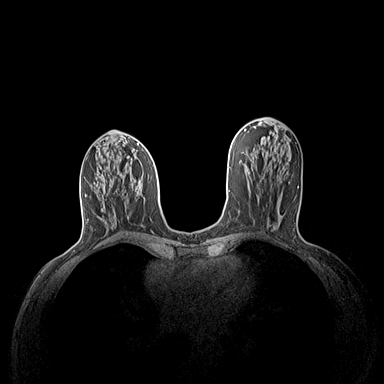
[im 78/144]
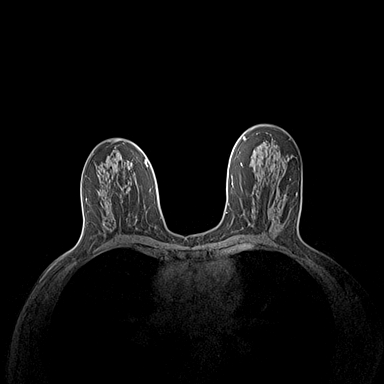
[im 100/144]
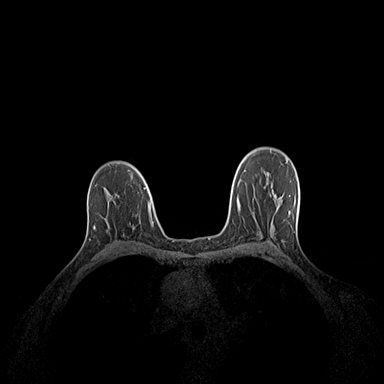
[im 122/144]
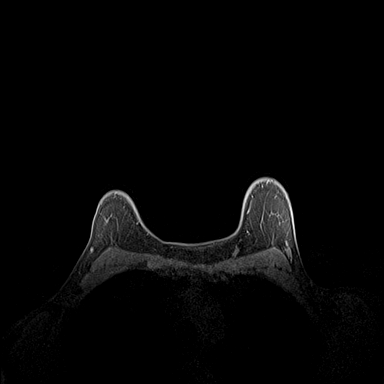
[im 144/144]
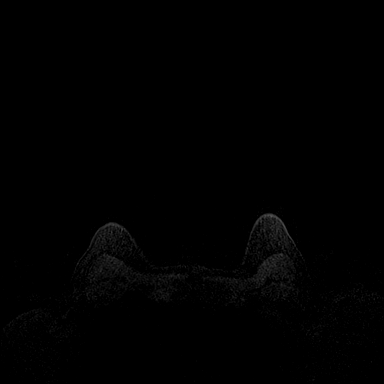

[Series 4: fl3d post-cm 20 · axial · 1.2mm · 0.89mm/px · z∈[-86,+86]mm · 8 of 144 slices shown (1 of 2)]
[im 1/144]
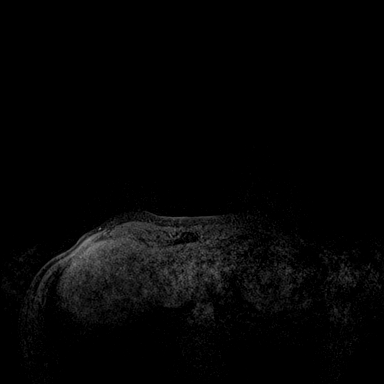
[im 23/144]
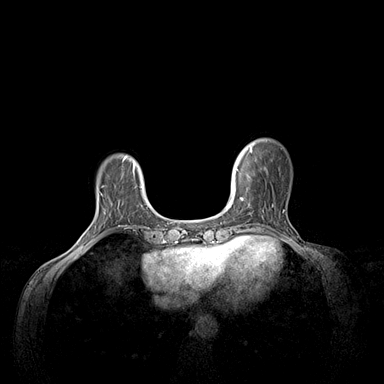
[im 45/144]
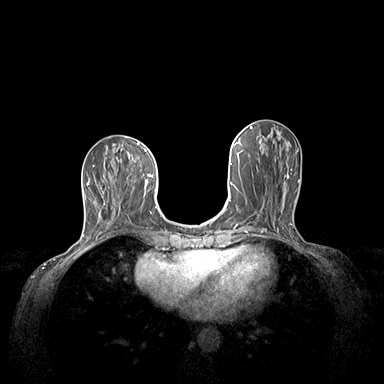
[im 67/144]
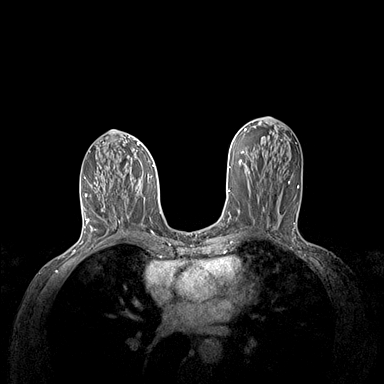
[im 78/144]
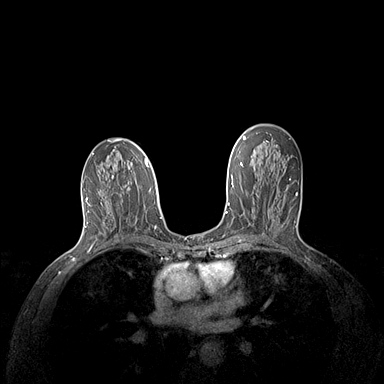
[im 100/144]
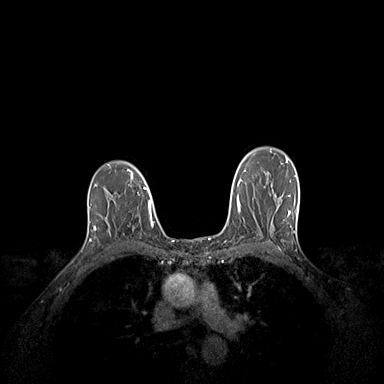
[im 122/144]
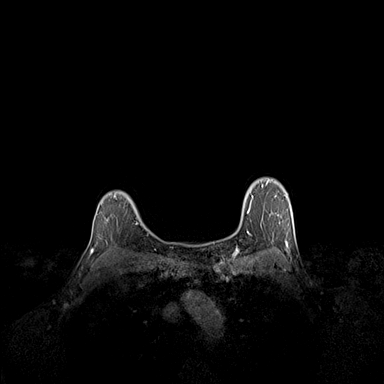
[im 144/144]
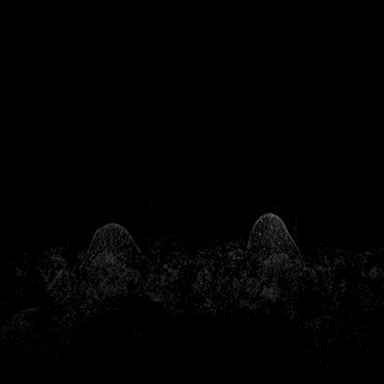

[Series 5: fl3d post-cm 20 · axial · 1.2mm · 0.89mm/px · z∈[-86,+86]mm · 8 of 144 slices shown (2 of 2)]
[im 1/144]
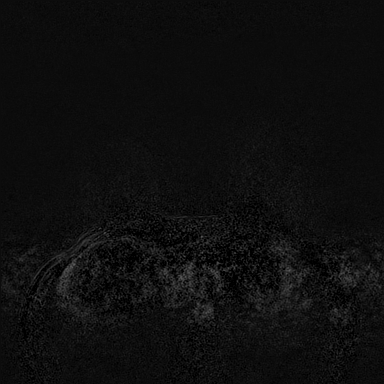
[im 23/144]
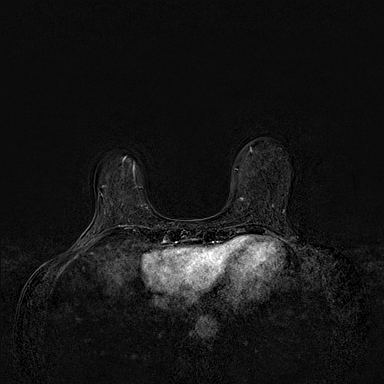
[im 45/144]
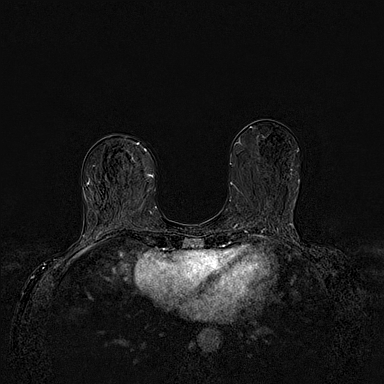
[im 67/144]
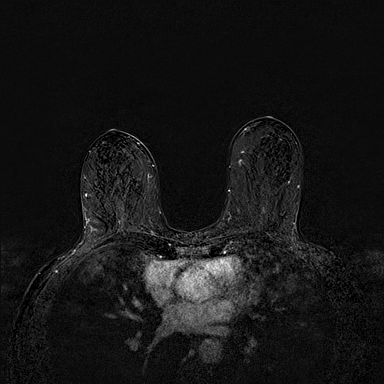
[im 78/144]
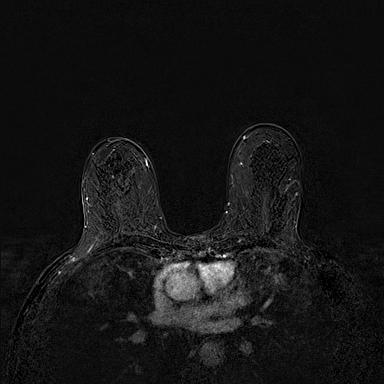
[im 100/144]
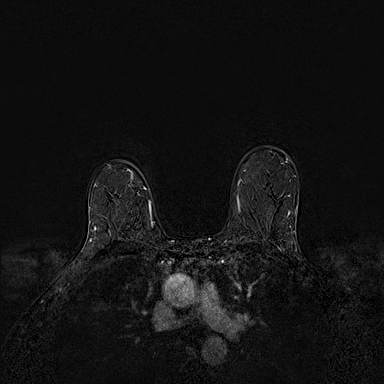
[im 122/144]
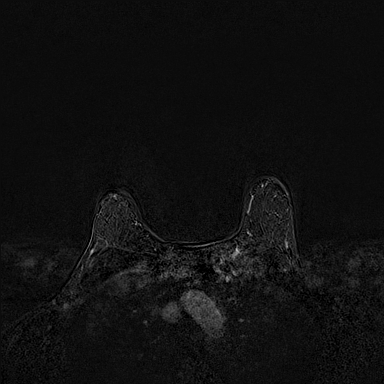
[im 144/144]
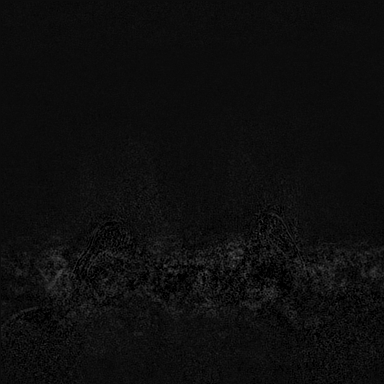

[29 of 48 positions shown; findings below may reference images not displayed]

Three-dimensional MR images were rendered by post-processing of the
original MR data on an independent workstation. The
three-dimensional MR images were interpreted, and findings are
reported in the following complete MRI report for this study. Three
dimensional images were evaluated at the independent [employer]
workstation
FINDINGS: Breast composition: c. Heterogeneous fibroglandular tissue.

Background parenchymal enhancement: Mild

Right breast: There is an enhancing mass in the central right
breast, just above the nipple line, measuring 7 x 4 x 7 mm. Mass
shows hyperintense signal on T2 weighted imaging. There is no
visible mammographic correlate to this. There are no other right
breast masses or areas of abnormal enhancement.

Left breast: No mass or abnormal enhancement.

Lymph nodes: No abnormal appearing lymph nodes.

Ancillary findings:  None.
IMPRESSION: 1. 7 mm enhancing mass in the central right breast, slightly towards
12 o'clock, with associated hyperintense signal on T2 weighted
imaging. Signal characteristics is still favor a benign mass.
2. No other masses.  No other areas of abnormal enhancement.

RECOMMENDATION:
1. Recommend targeted right breast ultrasound, to assess for the 7
mm MRI mass, which should be near 12 o'clock, 1-2 cm from the
nipple, middle depth. If the lesion cannot be visualized
sonographically, MRI guided core needle biopsy would be recommended.

BI-RADS CATEGORY  4: Suspicious.

## 2022-03-05 MED ORDER — GADOBUTROL 1 MMOL/ML IV SOLN
7.0000 mL | Freq: Once | INTRAVENOUS | Status: AC | PRN
  Administered 2022-03-05: 7 mL via INTRAVENOUS

## 2022-03-06 ENCOUNTER — Other Ambulatory Visit: Payer: Self-pay | Admitting: Family Medicine

## 2022-03-06 ENCOUNTER — Telehealth: Payer: Self-pay | Admitting: Family Medicine

## 2022-03-06 DIAGNOSIS — N6310 Unspecified lump in the right breast, unspecified quadrant: Secondary | ICD-10-CM

## 2022-03-06 NOTE — Telephone Encounter (Signed)
I called and discussed breast MRI with her. All questions were answered. Breast biopsy recommended. I called and discussed this with the radiologist - Lajean Manes M.D. ?He recommended ordering [place] with instruction: Second look breast US due to abnormal breast MRI - biopsy indicated. ?Order placed. I called and discussed with the patient. GI will call her for an appointment. I also advised her to call GI, if she does not hear from their office to schedule her [place]. She agreed with the plan. ? ?MR BREAST W & WO CM SCREENING (GI) ? ?Result Date: 03/06/2022 ?CLINICAL DATA:  Abbreviated Breast MRI for breast cancer screening. Supplemental screening. Family history with breast carcinoma diagnosed in a niece at 32 and an aunt at 17. Previous left breast benign biopsy in 2000. EXAM: BILATERAL ABBREVIATED BREAST MRI WITH AND WITHOUT CONTRAST TECHNIQUE: Multiplanar, multisequence MR images of both breasts were obtained prior to and following the intravenous administration of 7 ml of Gadavist Three-dimensional MR images were rendered by post-processing of the original MR data on an independent workstation. The three-dimensional MR images were interpreted, and findings are reported in the following complete MRI report for this study. Three dimensional images were evaluated at the independent [employer] workstation COMPARISON:  Prior mammograms. FINDINGS: Breast composition: c. Heterogeneous fibroglandular tissue. Background parenchymal enhancement: Mild Right breast: There is an enhancing mass in the central right breast, just above the nipple line, measuring 7 x 4 x 7 mm. Mass shows hyperintense signal on T2 weighted imaging. There is no visible mammographic correlate to this. There are no other right breast masses or areas of abnormal enhancement. Left breast: No mass or abnormal enhancement. Lymph nodes: No abnormal appearing lymph nodes. Ancillary findings:  None. IMPRESSION: 1. 7 mm enhancing mass in the central right breast,  slightly towards 12 o'clock, with associated hyperintense signal on T2 weighted imaging. Signal characteristics is still favor a benign mass. 2. No other masses.  No other areas of abnormal enhancement. RECOMMENDATION: 1. Recommend targeted right breast ultrasound, to assess for the 7 mm MRI mass, which should be near 12 o'clock, 1-2 cm from the nipple, middle depth. If the lesion cannot be visualized sonographically, MRI guided core needle biopsy would be recommended. BI-RADS CATEGORY  4: Suspicious. Electronically Signed   By: Lajean Manes M.D.   On: 03/06/2022 10:18   ? ?

## 2022-03-12 ENCOUNTER — Ambulatory Visit
Admission: RE | Admit: 2022-03-12 | Discharge: 2022-03-12 | Disposition: A | Discharge status: Home / Self Care | Payer: 59 | Source: Ambulatory Visit | Attending: Family Medicine | Admitting: Family Medicine

## 2022-03-12 DIAGNOSIS — N6310 Unspecified lump in the right breast, unspecified quadrant: Secondary | ICD-10-CM

## 2022-03-12 IMAGING — US US BREAST*R* LIMITED INC AXILLA
1 series · 5 of 5 positions shown · non-contrast
Comparison: Previous exams.

CLINICAL DATA: 60-year-old female with recent high risk screening
breast MRI demonstrating a 0.7 cm enhancing mass in the central
slightly upper right breast for which ultrasound evaluation and
possible biopsy was recommended.

EXAM:
ULTRASOUND OF THE RIGHT BREAST

[Series 1: us breast*right* limited inc axilla · 0.06mm/px · 5 of 5 slices shown]
[im 1/5]
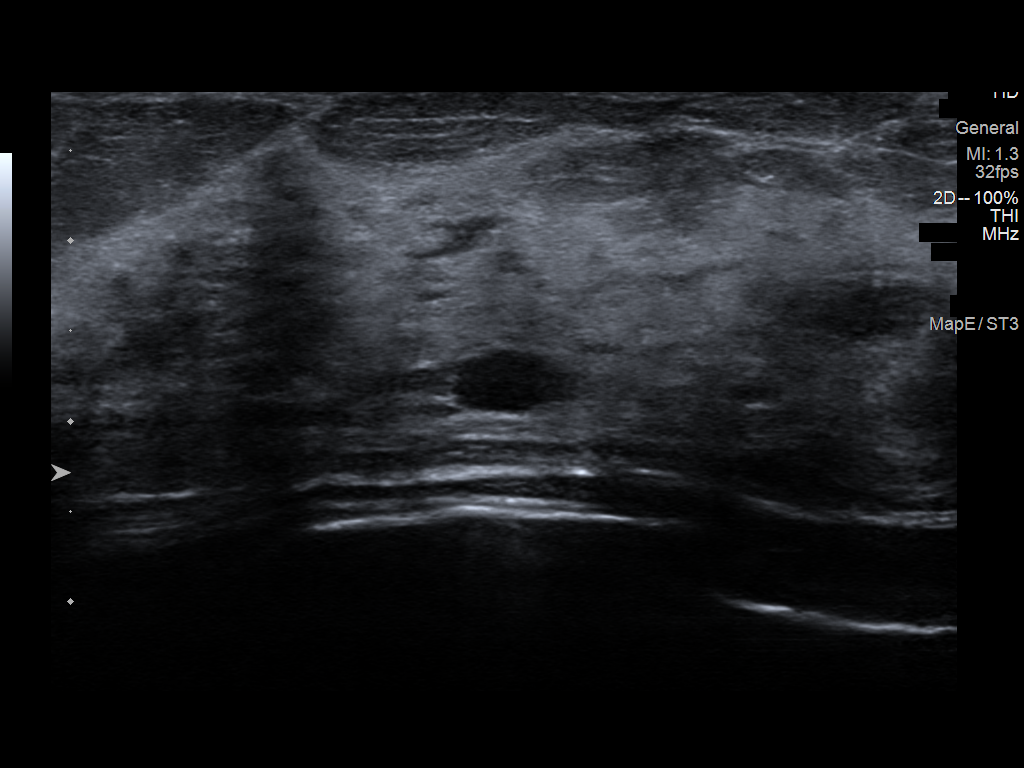
[im 2/5]
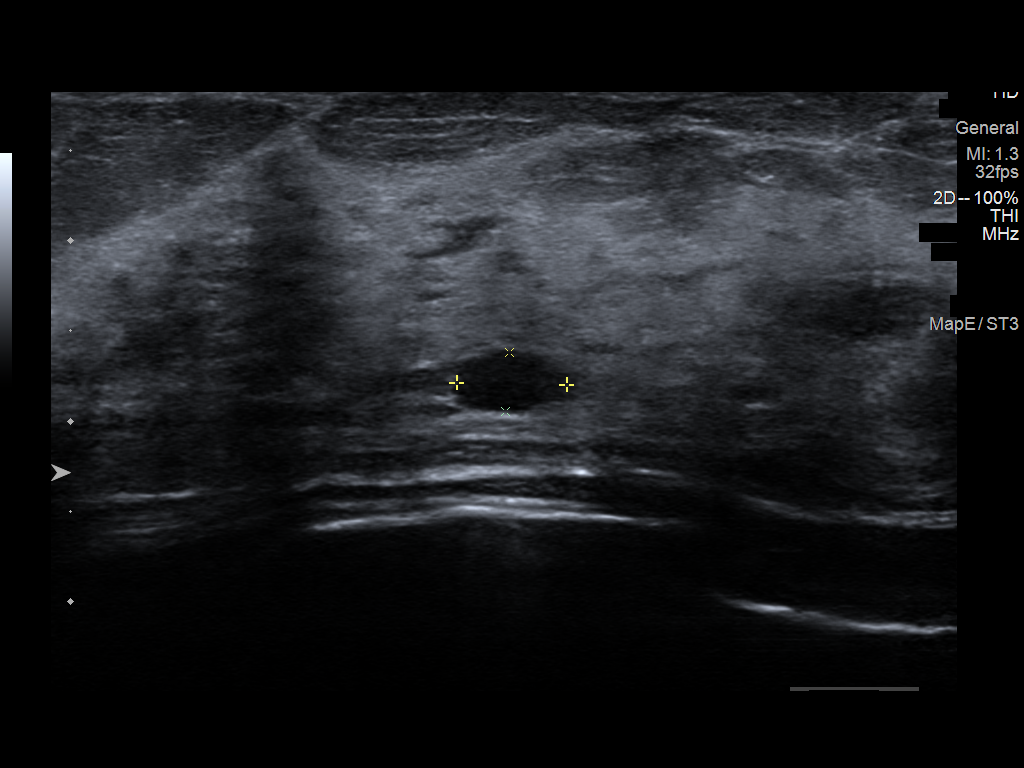
[im 3/5]
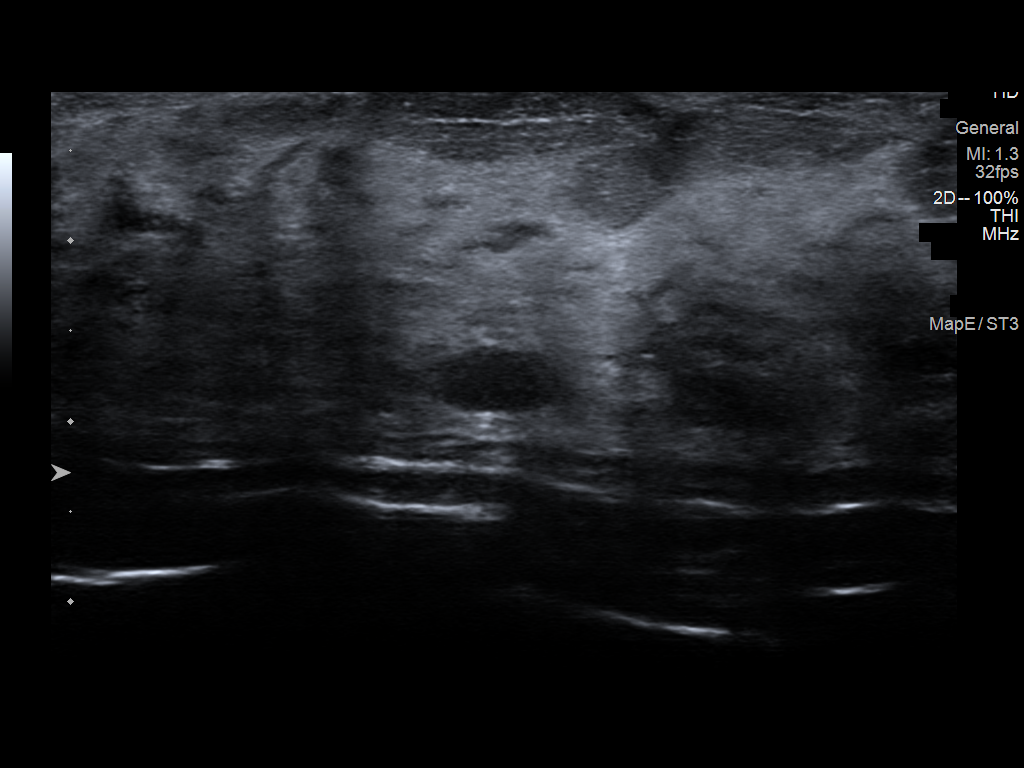
[im 4/5]
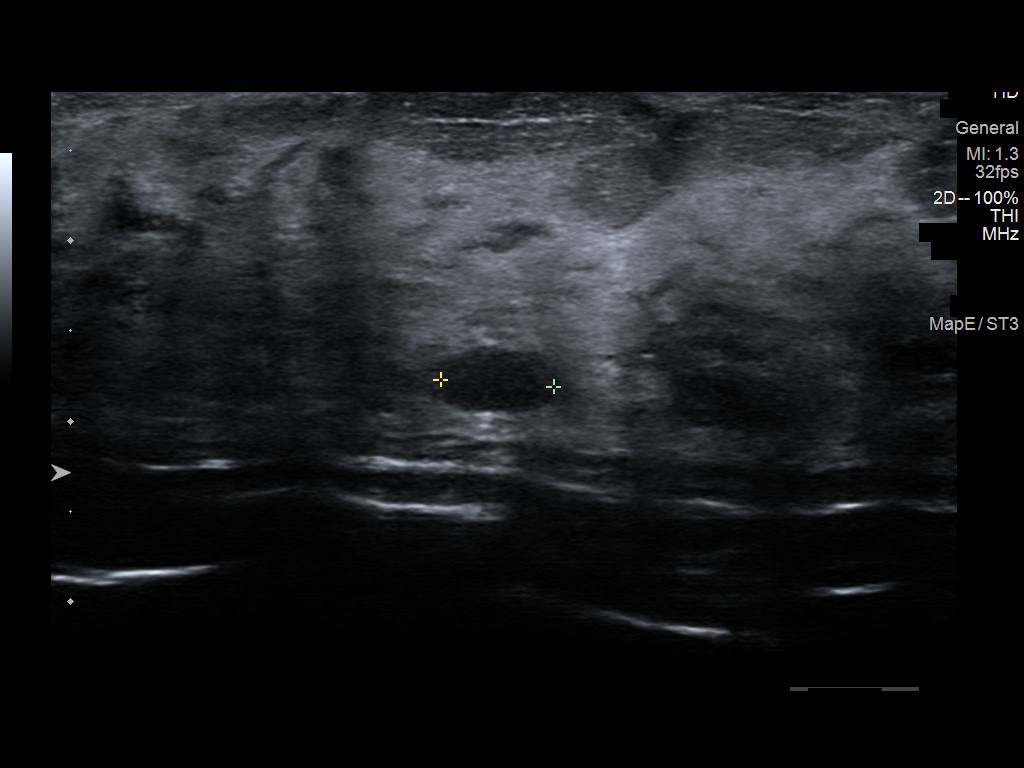
[im 5/5]
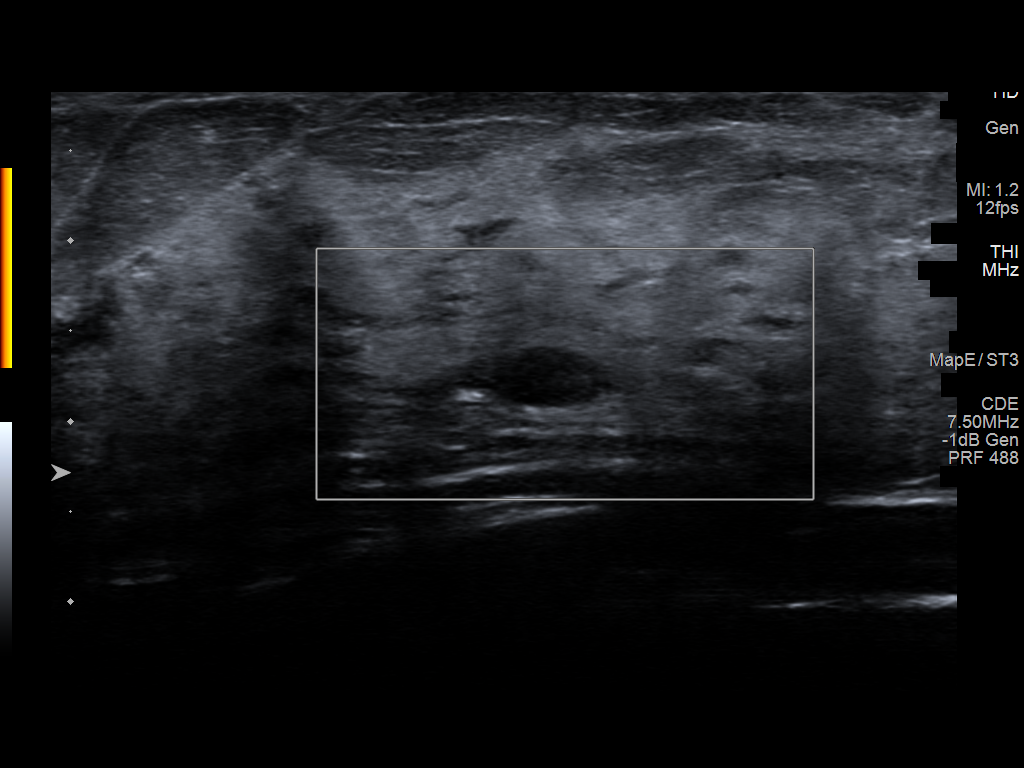

[5 of 5 positions shown; findings below may reference images not displayed]

FINDINGS: Targeted ultrasound of the entire central and upper right breast was
performed demonstrating few scattered areas of fibrocystic change
with a representative cyst in the right breast at 12 o'clock
retroareolar measuring 0.6 x 0.3 x 0.6 cm. It is unlikely this cyst
corresponds with the mass seen in the right breast on recent MRI. No
definite sonographic correlate for the enhancing mass seen in the
right breast on recent MRI.
IMPRESSION: No definite sonographic correlate for the mass seen in the central
to slightly upper right breast on recent MRI.

RECOMMENDATION:
Recommend MRI guided biopsy of the 0.7 cm mass in the central to
slightly upper right breast.

I have discussed the findings and recommendations with the patient.
If applicable, a reminder letter will be sent to the patient
regarding the next appointment.

BI-RADS CATEGORY  4: Suspicious.

## 2022-03-13 ENCOUNTER — Telehealth: Payer: Self-pay | Admitting: Family Medicine

## 2022-03-13 ENCOUNTER — Other Ambulatory Visit: Payer: Self-pay | Admitting: Family Medicine

## 2022-03-13 DIAGNOSIS — R9389 Abnormal findings on diagnostic imaging of other specified body structures: Secondary | ICD-10-CM

## 2022-03-13 NOTE — Telephone Encounter (Signed)
I was unable to find MRI breast biopsy on Epic. I called the radiology and spoke with Dr. Earlene Plater - he is uncertain which order to place either, but will get one of the staff to contact me soon with that information. ?I will place order as soon as I receive information. ?

## 2022-03-13 NOTE — Telephone Encounter (Signed)
HIPAA compliant callback message left. ? ?Please advise when she calls: ? ?Radiology recommended MRI breast biopsy. Confirm she is aware and has scheduled an appointment with [facility] imaging for this. If not, let me know. ?Thanks.  ?

## 2022-03-13 NOTE — Telephone Encounter (Signed)
Patient returns call to nurse line.  ? ?Patient is aware of next steps, however does not have this scheduled yet.  ? ?If order has been placed I can help her get scheduled.  ?

## 2022-03-14 NOTE — Telephone Encounter (Signed)
I received paper order for MRI guided breast biopsy for this patient. I have signed the order and placed it in the fax box in the front office for processing. ?

## 2022-03-20 NOTE — Telephone Encounter (Signed)
Patient is already scheduled for 04-02-22.  Madison Booker,CMA ? ?

## 2022-04-02 ENCOUNTER — Ambulatory Visit
Admission: RE | Admit: 2022-04-02 | Discharge: 2022-04-02 | Disposition: A | Discharge status: Home / Self Care | Payer: No Typology Code available for payment source | Source: Ambulatory Visit | Attending: Family Medicine | Admitting: Family Medicine

## 2022-04-02 DIAGNOSIS — R9389 Abnormal findings on diagnostic imaging of other specified body structures: Secondary | ICD-10-CM

## 2022-04-02 HISTORY — PX: BREAST BIOPSY: SHX20

## 2022-04-02 IMAGING — MG MM BREAST LOCALIZATION CLIP
4 series · 4 of 12 positions shown · non-contrast
Comparison: Previous exam(s).

CLINICAL DATA: Evaluate biopsy marker

EXAM:
3D DIAGNOSTIC RIGHT MAMMOGRAM POST ULTRASOUND BIOPSY

[R CC synth-2D]
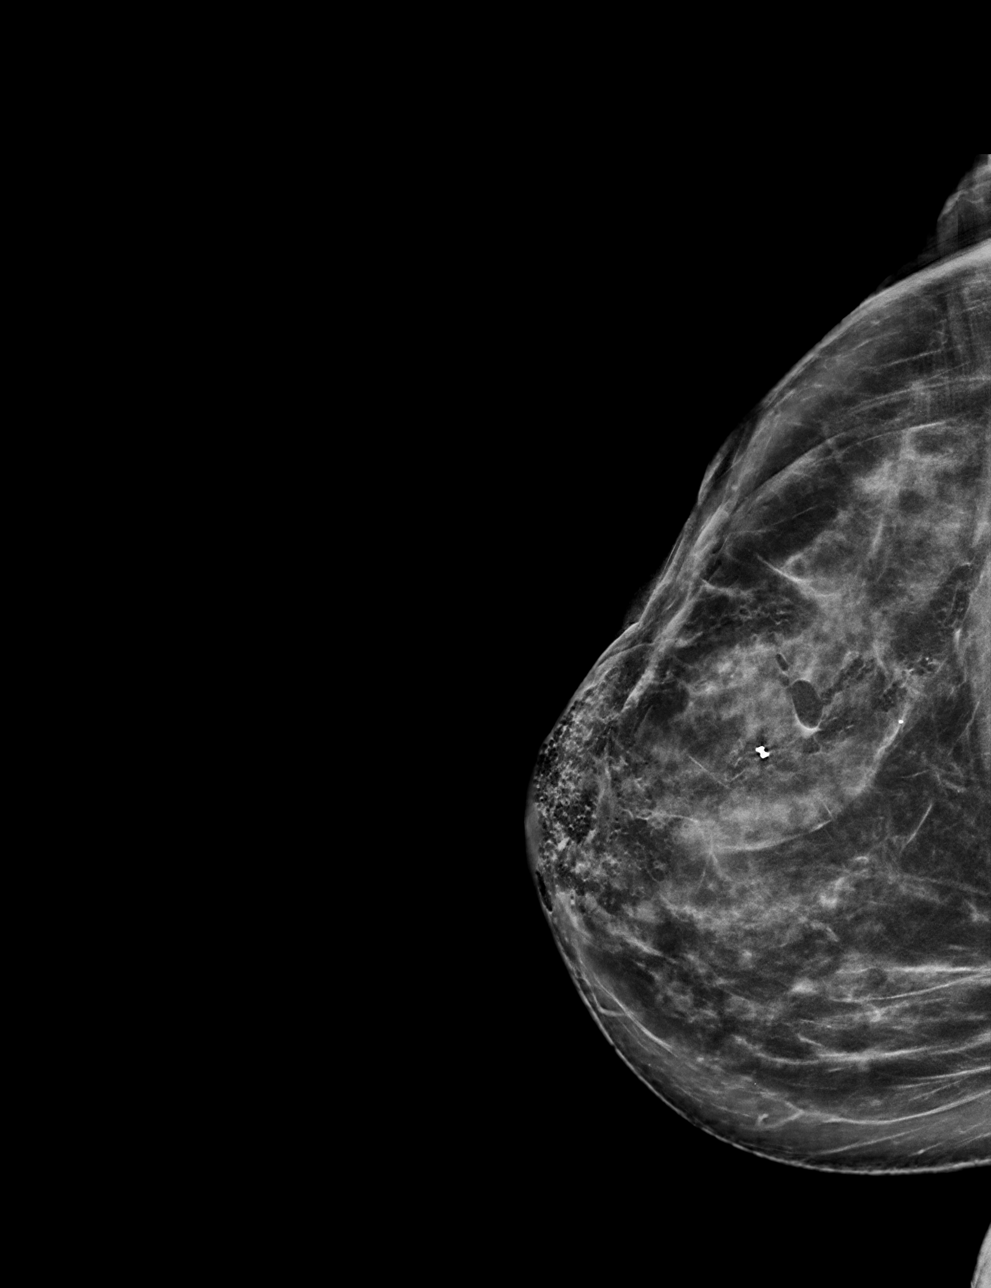

[R ML synth-2D]
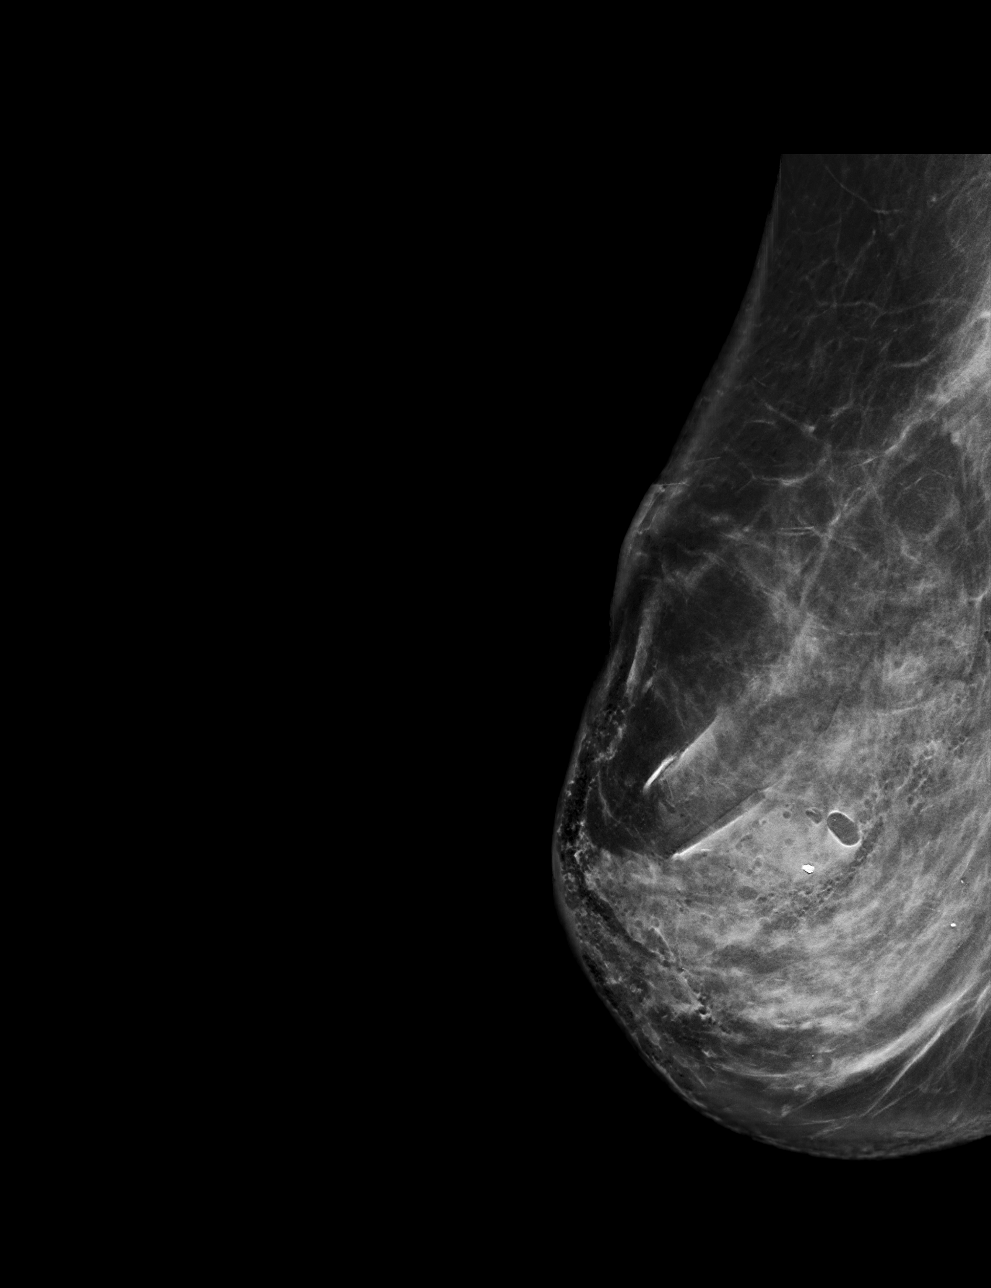

[R ML tomo · tomo slice 43/85.0]
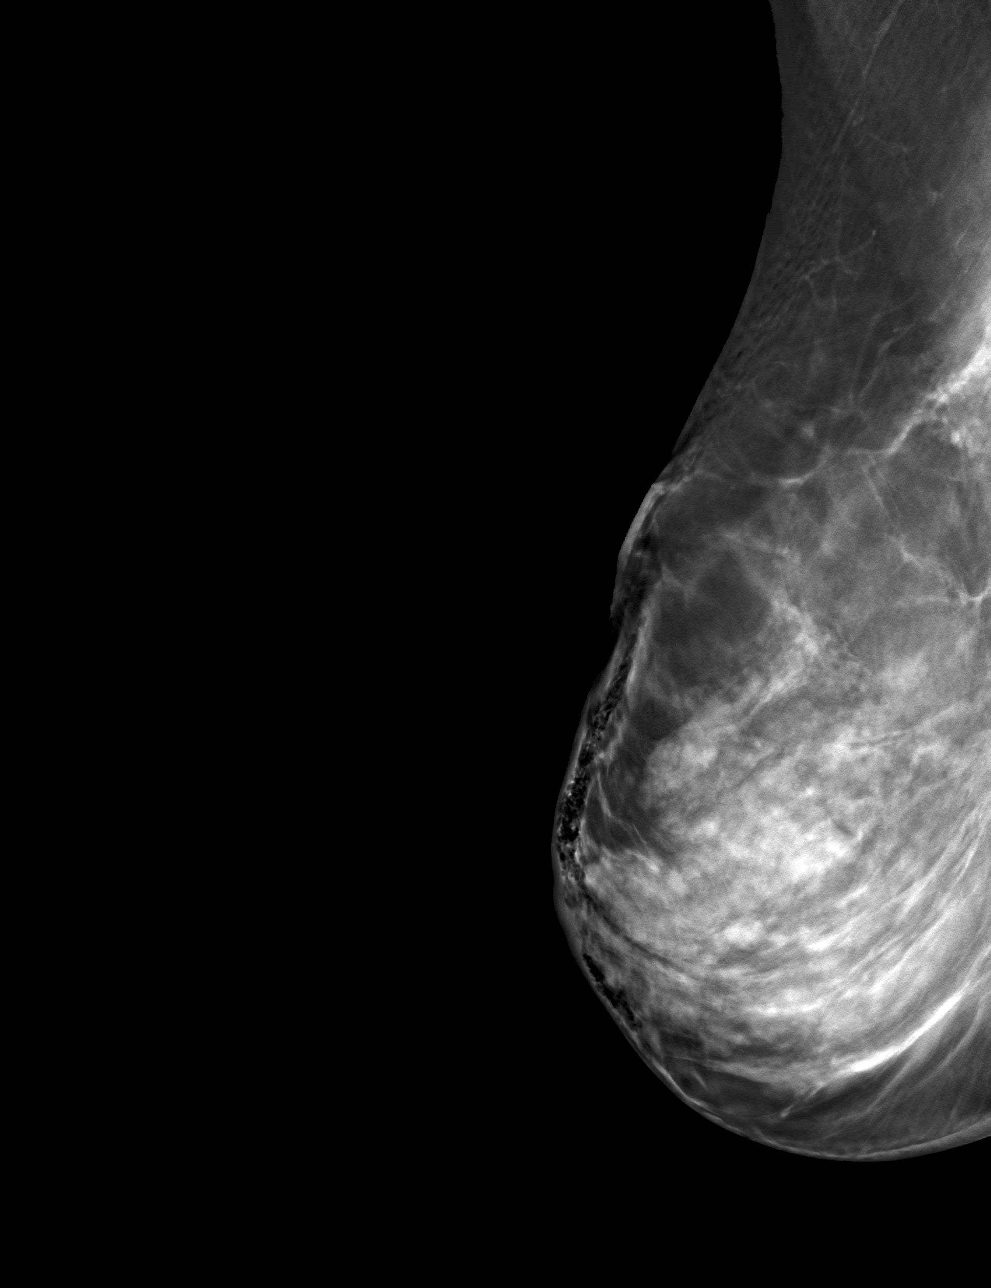

[R CC tomo · tomo slice 42/83.0]
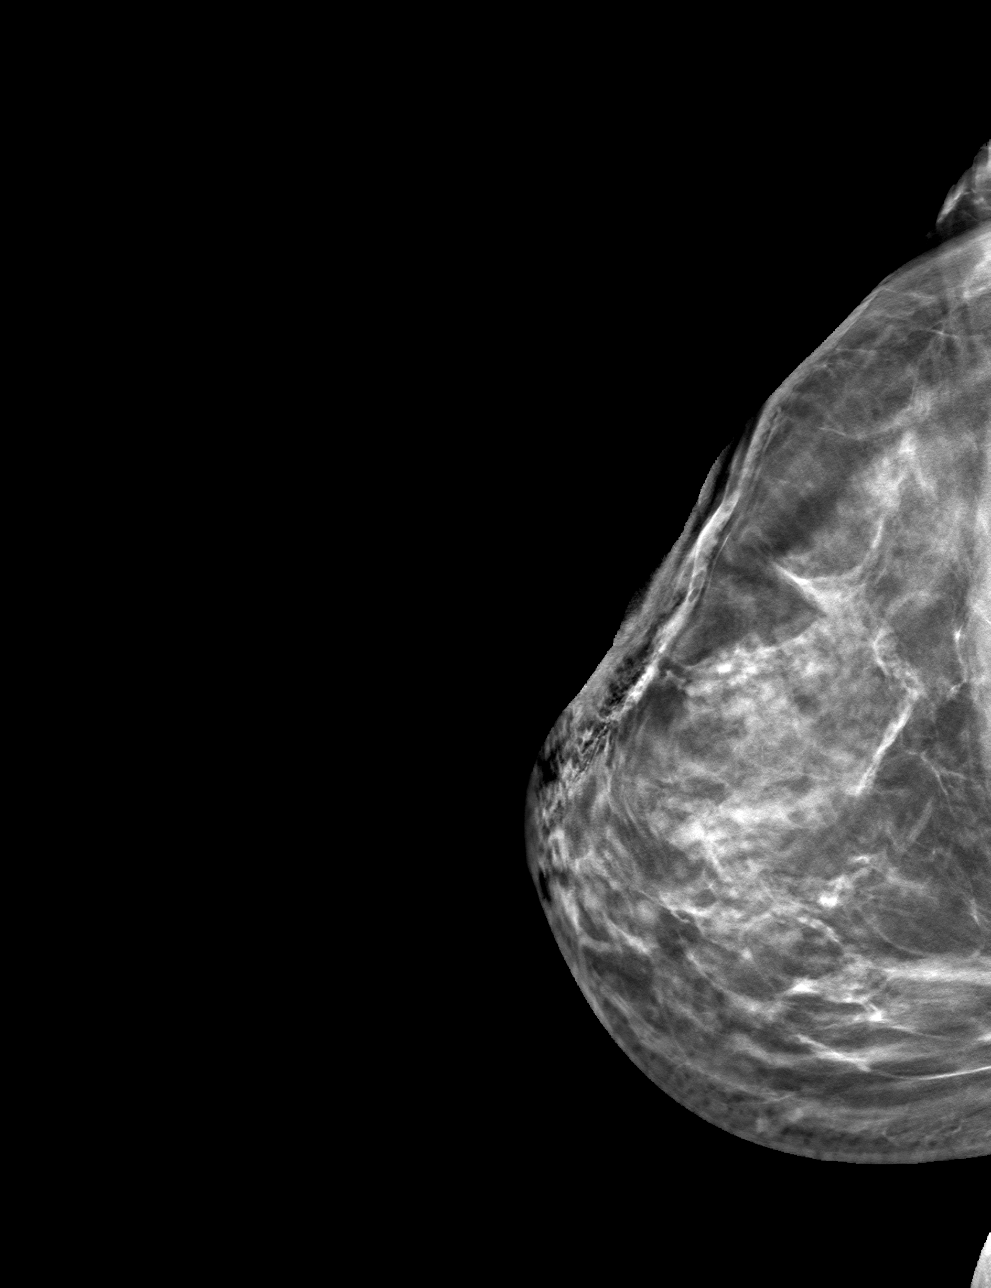

[4 of 12 positions shown; findings below may reference images not displayed]

FINDINGS: 3D Mammographic images were obtained following MRI guided biopsy of
a right breast mass. The biopsy marking clip is in expected position
at the site of biopsy.
IMPRESSION: Appropriate positioning of the SMEDLEY shaped biopsy marking clip at
the site of biopsy in the location of the biopsied right breast
mass.

Final Assessment: Post Procedure Mammograms for Marker Placement

## 2022-04-02 IMAGING — MR MR BREAST BX W/ LOC DEV 1ST LEASION IMAGE BX SPEC MR GUIDE*R*
5 of 6 series · 33 of 48 positions shown · IV contrast (7 gadavist)
Comparison: Recent breast MRI
COMPARISON: Recent breast MRI

Addendum:
CLINICAL DATA: Biopsy of a mass in the right breast.

EXAM:
MRI GUIDED CORE NEEDLE BIOPSY OF THE RIGHT BREAST
TECHNIQUE: Multiplanar, multisequence MR imaging of the right breast was
performed both before and after administration of intravenous
contrast.
CONTRAST:  7mL GADAVIST GADOBUTROL 1 MMOL/ML IV SOLN

[Series 2: fiducial unilateral · sagittal · 2.0mm · 1.33mm/px · 3 of 52 slices shown]
[im 1/52]
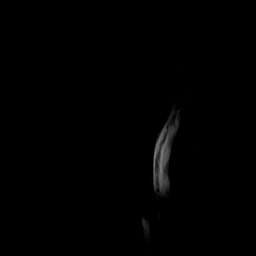
[im 26/52]
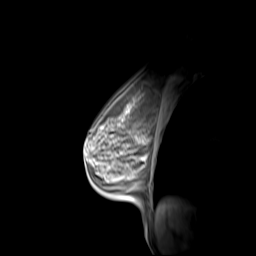
[im 52/52]
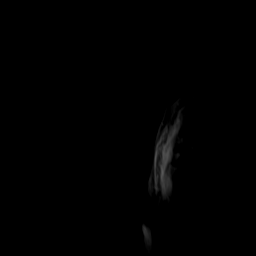

[Series 3: dynamic pre · axial · non-contrast · 1.3mm · 0.73mm/px · z∈[-98,+88]mm · 9 of 144 slices shown]
[im 1/144]
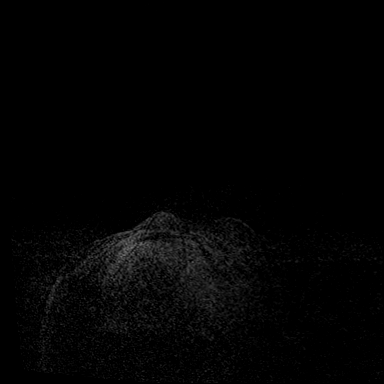
[im 18/144]
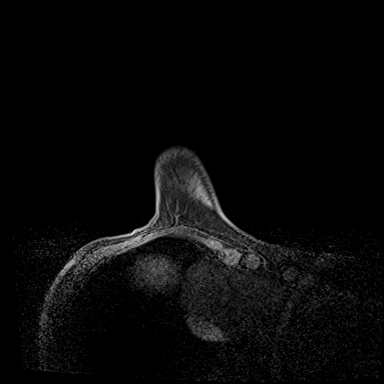
[im 36/144]
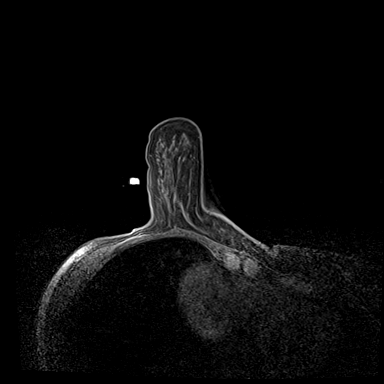
[im 54/144]
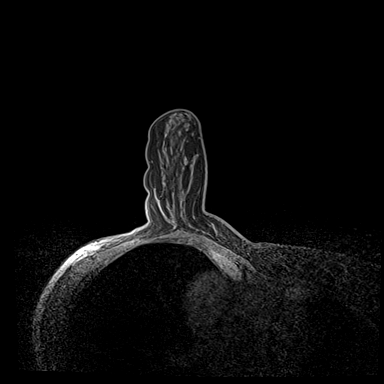
[im 72/144]
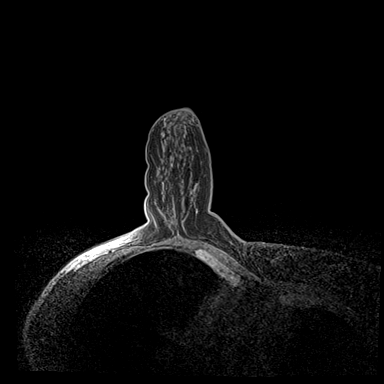
[im 90/144]
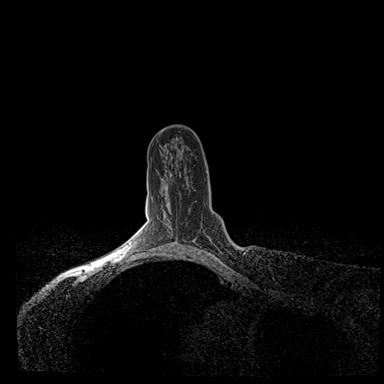
[im 108/144]
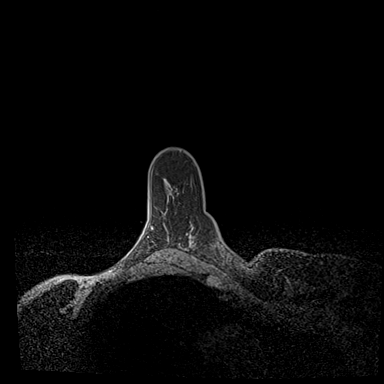
[im 126/144]
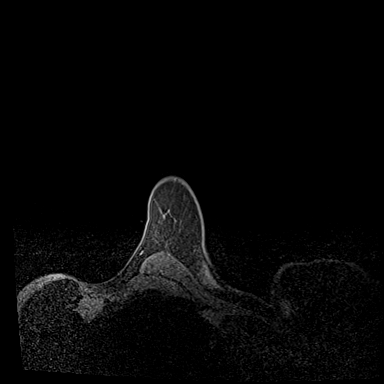
[im 144/144]
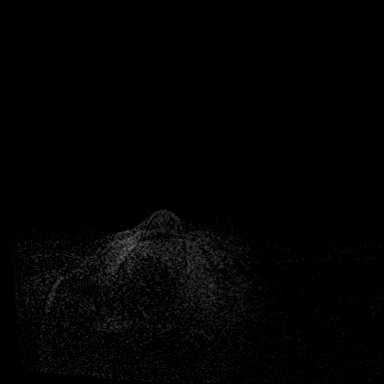

[Series 4: dynamic post 20 · axial · 1.3mm · 0.73mm/px · z∈[-98,+88]mm · 9 of 144 slices shown (1 of 2)]
[im 1/144]
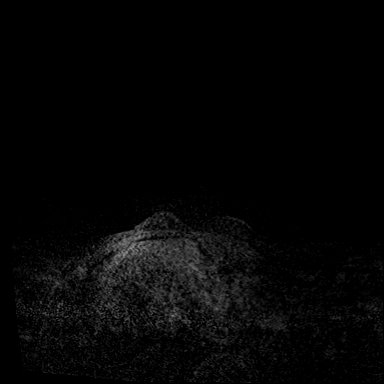
[im 18/144]
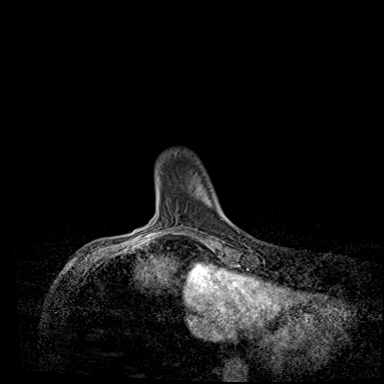
[im 36/144]
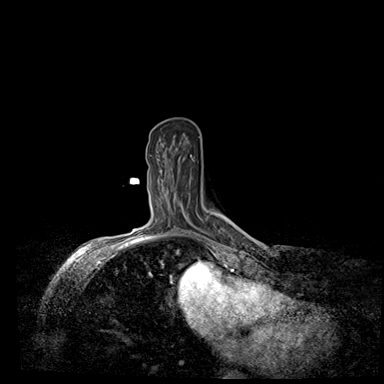
[im 54/144]
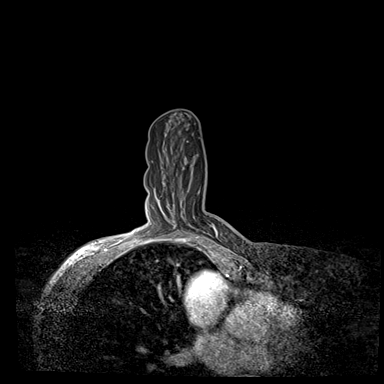
[im 72/144]
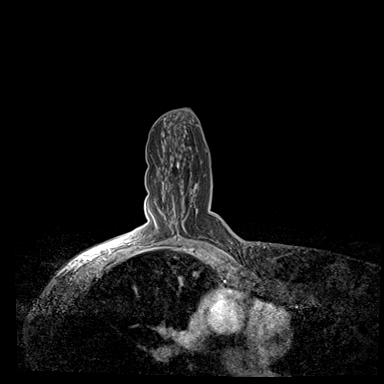
[im 90/144]
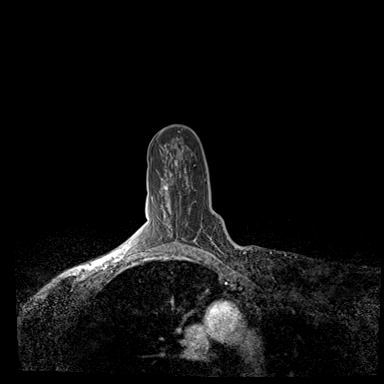
[im 108/144]
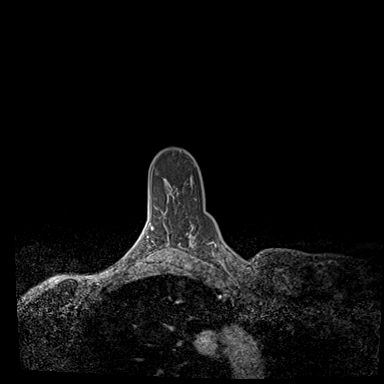
[im 126/144]
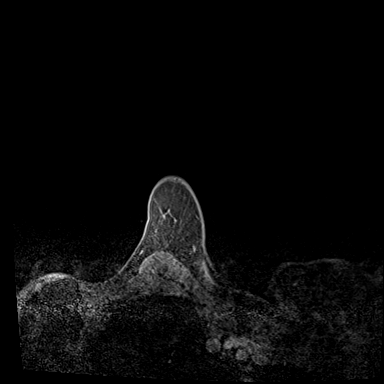
[im 144/144]
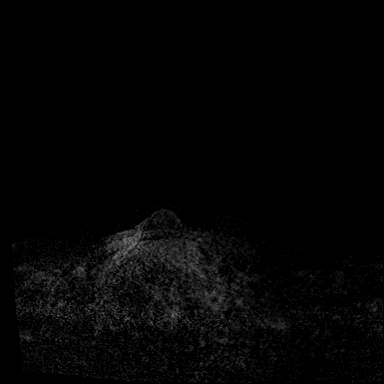

[Series 5: dynamic post 20 · axial · 1.3mm · 0.73mm/px · z∈[-98,+88]mm · 9 of 144 slices shown (2 of 2)]
[im 1/144]
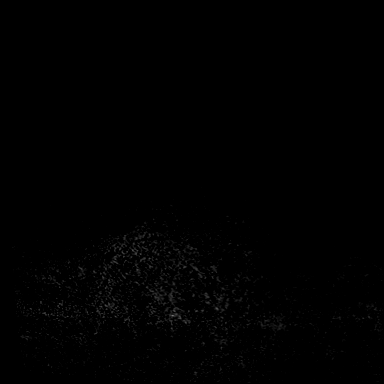
[im 18/144]
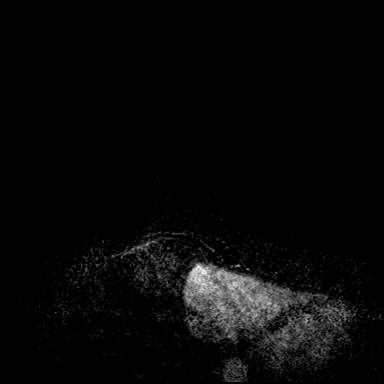
[im 36/144]
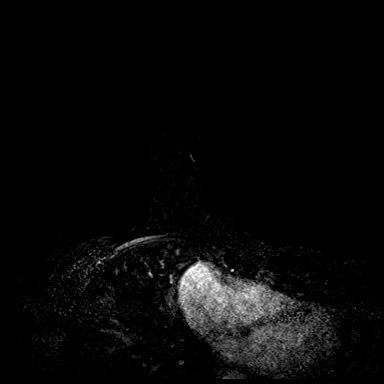
[im 54/144]
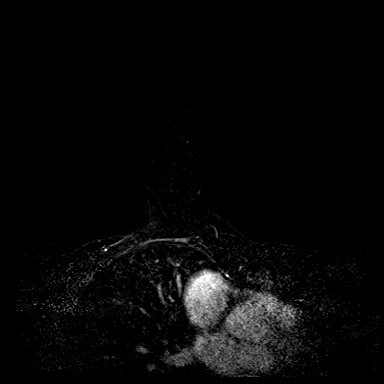
[im 72/144]
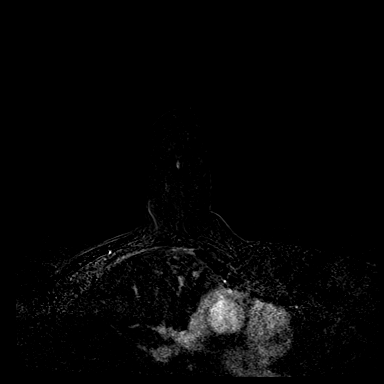
[im 90/144]
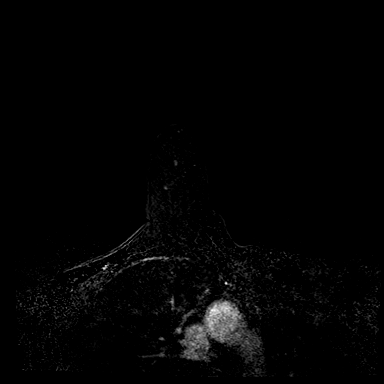
[im 108/144]
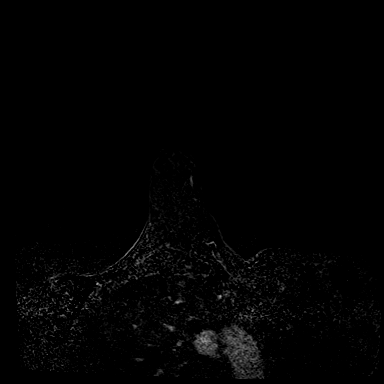
[im 126/144]
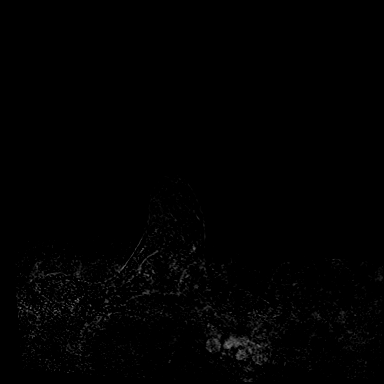
[im 144/144]
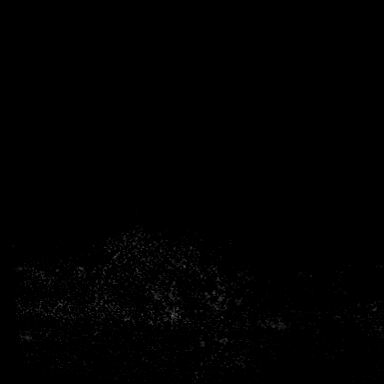

[Series 6: needle confirmation · axial · 1.3mm · 0.73mm/px · z∈[-98,-53]mm · 3 of 144 slices shown]
[im 1/144]
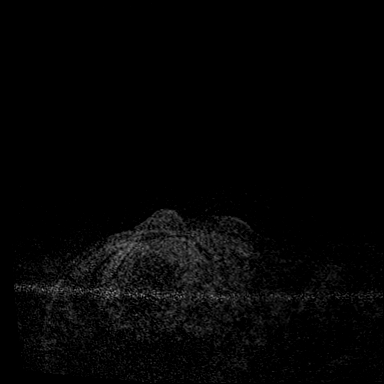
[im 18/144]
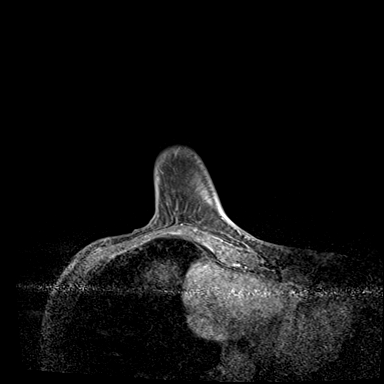
[im 36/144]
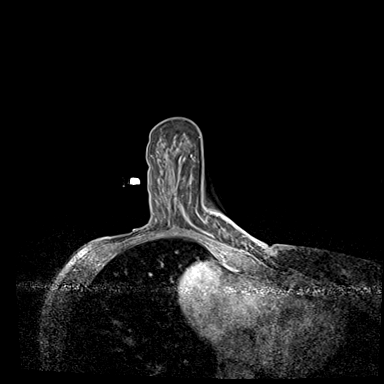

[33 of 48 positions shown; findings below may reference images not displayed]

FINDINGS: I met with the patient, and we discussed the procedure of MRI guided
biopsy, including risks, benefits, and alternatives. Specifically,
we discussed the risks of infection, bleeding, tissue injury, clip
migration, and inadequate sampling. Informed, written consent was
given. The usual time out protocol was performed immediately prior
to the procedure.

Using sterile technique, 1% Lidocaine, MRI guidance, and a 9 gauge
vacuum assisted device, biopsy was performed of a central mass in
the right breast using a lateral approach. At the conclusion of the
procedure, a dumbbell shaped tissue marker clip was deployed into
the biopsy cavity. Follow-up 2-view mammogram was performed and
dictated separately.
IMPRESSION: MRI guided biopsy of a central right breast mass. No apparent
complications.

ADDENDUM:
Pathology revealed FIBROCYSTIC CHANGES WITH INCREASED STROMAL
FIBROSIS AND FOCAL PERIDUCTULAR CHRONIC INFLAMMATION- NO MALIGNANCY
IDENTIFIED of the RIGHT breast, central, 12 o'clock (dumbbell-shaped
clip). This was found to be concordant by Dr. SUDESHWAR.

Pathology results were discussed with the patient by telephone. The
patient reported doing well after the biopsy with tenderness at the
site. Post biopsy instructions and care were reviewed and questions
were answered. The patient was encouraged to call The [REDACTED]

Bilateral breast MRI recommended in 6 months per protocol. Patient
could have traditional or abbreviated MRI at 6 month follow up.

Pathology results reported by SUDESHWAR RN on [DATE].

*** End of Addendum ***
FINDINGS: I met with the patient, and we discussed the procedure of MRI guided
biopsy, including risks, benefits, and alternatives. Specifically,
we discussed the risks of infection, bleeding, tissue injury, clip
migration, and inadequate sampling. Informed, written consent was
given. The usual time out protocol was performed immediately prior
to the procedure.

Using sterile technique, 1% Lidocaine, MRI guidance, and a 9 gauge
vacuum assisted device, biopsy was performed of a central mass in
the right breast using a lateral approach. At the conclusion of the
procedure, a dumbbell shaped tissue marker clip was deployed into
the biopsy cavity. Follow-up 2-view mammogram was performed and
dictated separately.
IMPRESSION: MRI guided biopsy of a central right breast mass. No apparent
complications.

## 2022-04-02 MED ORDER — GADOBUTROL 1 MMOL/ML IV SOLN
7.0000 mL | Freq: Once | INTRAVENOUS | Status: AC | PRN
  Administered 2022-04-02: 7 mL via INTRAVENOUS

## 2022-04-28 ENCOUNTER — Encounter: Payer: Self-pay | Admitting: *Deleted

## 2022-09-09 ENCOUNTER — Other Ambulatory Visit: Payer: Self-pay | Admitting: Family Medicine

## 2022-09-09 ENCOUNTER — Encounter: Payer: Self-pay | Admitting: Family Medicine

## 2022-09-09 DIAGNOSIS — Z1239 Encounter for other screening for malignant neoplasm of breast: Secondary | ICD-10-CM

## 2022-09-09 DIAGNOSIS — R9389 Abnormal findings on diagnostic imaging of other specified body structures: Secondary | ICD-10-CM

## 2022-09-11 ENCOUNTER — Other Ambulatory Visit: Payer: Self-pay | Admitting: Physician Assistant

## 2022-09-15 ENCOUNTER — Encounter: Payer: Self-pay | Admitting: Family Medicine

## 2022-09-15 NOTE — Telephone Encounter (Signed)
Pt is scheduled for 11/14 @8 :30 am

## 2022-10-06 ENCOUNTER — Encounter: Payer: No Typology Code available for payment source | Admitting: Family Medicine

## 2022-10-08 ENCOUNTER — Telehealth: Payer: Self-pay

## 2022-10-08 ENCOUNTER — Ambulatory Visit: Payer: No Typology Code available for payment source

## 2022-10-08 NOTE — Telephone Encounter (Signed)
Received phone call from Clydie Braun at [facility] regarding patient's breast MRI. This imaging study needs prior authorization. She was scheduled for today, however, had to be canceled due to issue.   Clydie Braun is requesting returned call at [phone] once this has been authorized.   Will forward to Jazmin for authorization.   Veronda Prude, RN

## 2022-10-08 NOTE — Telephone Encounter (Signed)
Online system is down.  Will try for PA tomorrow (10-09-22).  Jarica Plass,CMA

## 2022-10-09 NOTE — Telephone Encounter (Signed)
PA pending with [employer]: Case Number: [id number]

## 2022-10-13 ENCOUNTER — Encounter: Payer: Self-pay | Admitting: Family Medicine

## 2022-10-13 ENCOUNTER — Ambulatory Visit (INDEPENDENT_AMBULATORY_CARE_PROVIDER_SITE_OTHER): Payer: No Typology Code available for payment source | Admitting: Family Medicine

## 2022-10-13 VITALS — BP 111/53 | HR 51 | Ht 61.0 in | Wt 149.6 lb

## 2022-10-13 DIAGNOSIS — Z1159 Encounter for screening for other viral diseases: Secondary | ICD-10-CM | POA: Diagnosis not present

## 2022-10-13 DIAGNOSIS — E785 Hyperlipidemia, unspecified: Secondary | ICD-10-CM | POA: Diagnosis not present

## 2022-10-13 DIAGNOSIS — Z0000 Encounter for general adult medical examination without abnormal findings: Secondary | ICD-10-CM

## 2022-10-13 MED ORDER — CICLOPIROX 8 % EX SOLN: Freq: Every day | CUTANEOUS | 1 refills | Status: DC

## 2022-10-13 MED ORDER — ZOSTER VAC RECOMB ADJUVANTED 50 MCG/0.5ML IM SUSR: INTRAMUSCULAR | 1 refills | Status: DC

## 2022-10-13 NOTE — Patient Instructions (Signed)

## 2022-10-13 NOTE — Progress Notes (Signed)
    SUBJECTIVE:   Chief compliant/HPI: annual examination  Madison Booker is a 61 y.o. who presents today for an annual exam. Also concerned about left big toenail discoloration for about a month. She denies any injury, no pain.   Review of systems form notable for toe pain.   Updated history tabs and problem list.   OBJECTIVE:   BP (!) 111/53   Pulse (!) 51   Ht _0  (1.549 m)   Wt 149 lb 9.6 oz (67.9 kg)   SpO2 100%   BMI 28.27 kg/m   Physical Exam Vitals and nursing note reviewed.  Cardiovascular:     Rate and Rhythm: Normal rate and regular rhythm.     Heart sounds: Normal heart sounds. No murmur heard. Pulmonary:     Effort: Pulmonary effort is normal. No respiratory distress.     Breath sounds: Normal breath sounds. No wheezing.  Abdominal:     General: Abdomen is flat. Bowel sounds are normal. There is no distension.     Palpations: Abdomen is soft.     Tenderness: There is no abdominal tenderness.  Musculoskeletal:     Right lower leg: No edema.     Left lower leg: No edema.     Comments: Scanty, yellowish brown discoloration of her left great toenail. No swelling, erythema, or tenderness  Neurological:     Mental Status: She is oriented to person, place, and time.     Cranial Nerves: No cranial nerve deficit.     Sensory: No sensory deficit.     Motor: No weakness.     Coordination: Coordination normal.     Gait: Gait normal.     Deep Tendon Reflexes: Reflexes normal.  Psychiatric:        Mood and Affect: Mood normal.        Behavior: Behavior normal.      ASSESSMENT/PLAN:    Annual Examination  See AVS for age appropriate recommendations.   PHQ score , reviewed and discussed. [place] Office Visit from 10/13/2022 in [place]  PHQ-9 Total Score 0      Blood pressure reviewed and at goal <140/90.  Asked about intimate partner violence and patient reports none.  The patient currently uses hysterectomy for  contraception.  Advanced directives discussed   Considered the following items based upon USPSTF recommendations: HIV testing: discussed and she is up to date with test Hepatitis C: discussed Hepatitis B: discussed Syphilis if at high risk: discussed and not at rsik GC/CT not at high risk and not ordered. Lipid panel (nonfasting or fasting) discussed based upon AHA recommendations and ordered.  Consider repeat every 4-6 years.  Reviewed risk factors for latent tuberculosis and not indicated  Discussed family history, BRCA testing not indicated. Tool used to risk stratify was Pedigree Assessment tool N/A - MR breast scheduled for Dec 2023  Cervical cancer screening: not indicated given history of hysterectomy with prior normal cytology.  Immunizations reviewed. Shingrix discussed and escribed to her pharmacy.   Follow up in 1 year or sooner if indicated.    Andrena Mews, MD [place]

## 2022-10-14 ENCOUNTER — Telehealth: Payer: Self-pay | Admitting: Family Medicine

## 2022-10-14 LAB — CMP14+EGFR
ALT: 38 IU/L — ABNORMAL HIGH (ref 0–32)
AST: 29 IU/L (ref 0–40)
Albumin/Globulin Ratio: 1.6 (ref 1.2–2.2)
Albumin: 4.1 g/dL (ref 3.8–4.9)
Alkaline Phosphatase: 107 IU/L (ref 44–121)
BUN/Creatinine Ratio: 16 (ref 9–23)
BUN: 11 mg/dL (ref 6–24)
Bilirubin Total: 1 mg/dL (ref 0.0–1.2)
CO2: 26 mmol/L (ref 20–29)
Calcium: 9.4 mg/dL (ref 8.7–10.2)
Chloride: 99 mmol/L (ref 96–106)
Creatinine, Ser: 0.7 mg/dL (ref 0.57–1.00)
Globulin, Total: 2.6 g/dL (ref 1.5–4.5)
Glucose: 76 mg/dL (ref 70–99)
Potassium: 4 mmol/L (ref 3.5–5.2)
Sodium: 138 mmol/L (ref 134–144)
Total Protein: 6.7 g/dL (ref 6.0–8.5)
eGFR: 100 mL/min/{1.73_m2} (ref >59)

## 2022-10-14 LAB — LIPID PANEL
Chol/HDL Ratio: 2.6 ratio (ref 0.0–4.4)
Cholesterol, Total: 217 mg/dL — ABNORMAL HIGH (ref 100–199)
HDL: 84 mg/dL (ref >39)
LDL Chol Calc (NIH): 122 mg/dL — ABNORMAL HIGH (ref 0–99)
Triglycerides: 65 mg/dL (ref 0–149)
VLDL Cholesterol Cal: 11 mg/dL (ref 5–40)

## 2022-10-14 LAB — HEPATITIS B SURFACE ANTIBODY, QUANTITATIVE: Hepatitis B Surf Ab Quant: 221.2 m[IU]/mL (ref >9.9)

## 2022-10-14 NOTE — Telephone Encounter (Addendum)
Test result discussed. Mildly elevated ALT - monitor for now since she is asymptomatic. FLP improved with ASCVD risk reduction from last year. She opted to continue lifestyle modification. Repeat FLP in a year. She was appreciative of the call.

## 2022-10-19 ENCOUNTER — Encounter: Payer: Self-pay | Admitting: Family Medicine

## 2022-10-19 ENCOUNTER — Ambulatory Visit (INDEPENDENT_AMBULATORY_CARE_PROVIDER_SITE_OTHER): Payer: No Typology Code available for payment source | Admitting: Family Medicine

## 2022-10-19 VITALS — BP 116/55 | HR 51 | Wt 157.5 lb

## 2022-10-19 DIAGNOSIS — B351 Tinea unguium: Secondary | ICD-10-CM

## 2022-10-19 HISTORY — DX: Tinea unguium: B35.1

## 2022-10-19 NOTE — Progress Notes (Signed)
    SUBJECTIVE:   CHIEF COMPLAINT / HPI: toenail discoloration  Patient returns for repeat toenail clippings due to lab error this morning.  Please see prior note from today for HPI.  PERTINENT  PMH / PSH: Reviewed  OBJECTIVE:   There were no vitals taken for this visit.  General: NAD Left Foot: First toe ungual hyperpigmentation proximally, increased yellowing of first nail, no obvious erythema or edema, nontender to palpation, s/p right sided nail clipping.  Toe Nail Clipping Procedure:  Patient informed of small risk of bleeding and infection, and alternative of just continuing antifungal topical medications. Patient voiced understanding and preference for procedure.  Nail and toe cleaned with alcohol swabs x2.  Left distal corner of left 1st toe nail shaving taken with nail scissors. Cleaned with alcohol swabs post procedure.  ASSESSMENT/PLAN:   Toenail fungus Toenail clipping performed today, pathology ordered. Pending results plan to either consider oral antifungal medication or referral to [facility] Derm clinic for toenail removal and pathology.      Celine Mans, MD [facility]

## 2022-10-19 NOTE — Assessment & Plan Note (Signed)
Toenail clipping performed today, pathology ordered.  Pending results plan to either consider oral antifungal medication or referral to [facility] Derm clinic for toenail removal and pathology. 

## 2022-10-19 NOTE — Patient Instructions (Signed)
It was great to see you! Thank you for allowing me to participate in your care!  I recommend that you always bring your medications to each appointment as this makes it easy to ensure we are on the correct medications and helps [place] not miss when refills are needed.   Take care and seek immediate care sooner if you develop any concerns.   Dr. Celine Mans, MD [facility]

## 2022-10-19 NOTE — Patient Instructions (Signed)
It was great to see you! Thank you for allowing me to participate in your care!  I recommend that you always bring your medications to each appointment as this makes it easy to ensure we are on the correct medications and helps [place] not miss when refills are needed.  Our plans for today:  -Today I shaved your left great toenail.  It will be sent off to the lab for pathology report.  You should receive your results within the next 1 to 2 weeks, I will let you know what the plan is when those results come back.   Take care and seek immediate care sooner if you develop any concerns.   Dr. Celine Mans, MD [facility]

## 2022-10-19 NOTE — Assessment & Plan Note (Signed)
Toenail clipping performed today, pathology ordered.  Pending results plan to either consider oral antifungal medication or referral to [facility] Derm clinic for toenail removal and pathology.

## 2022-10-19 NOTE — Progress Notes (Signed)
    SUBJECTIVE:   CHIEF COMPLAINT / HPI: toenail  Seen by Dr. Lum Babe on 10/13/2022.  Patient noted great toe nail discoloration at that time.  Was prescribed antifungal cream.  Patient reports improvement after use of antifungal cream.  Denies stubbing toe, denies bruising, denies itching, pain.  She only endorses discoloration of her toenail.  PERTINENT  PMH / PSH: Reviewed  OBJECTIVE:   BP (!) 116/55   Pulse (!) 51   Wt 157 lb 8 oz (71.4 kg)   SpO2 100%   BMI 29.76 kg/m   General: NAD Left Foot: First toe ungual hyperpigmentation approximately, increased yellowing of first nail, no obvious erythema or edema, nontender to palpation   ASSESSMENT/PLAN:   Toenail fungus Toenail clipping performed today, pathology ordered.  Pending results plan to either consider oral antifungal medication or referral to [facility] Derm clinic for toenail removal and pathology.     Celine Mans, MD [facility]

## 2022-10-23 NOTE — Telephone Encounter (Signed)
Auth #[id number] and patient is scheduled for 11/03/22

## 2022-11-03 ENCOUNTER — Ambulatory Visit
Admission: RE | Admit: 2022-11-03 | Discharge: 2022-11-03 | Disposition: A | Discharge status: Home / Self Care | Payer: No Typology Code available for payment source | Source: Ambulatory Visit | Attending: Family Medicine | Admitting: Family Medicine

## 2022-11-03 DIAGNOSIS — R9389 Abnormal findings on diagnostic imaging of other specified body structures: Secondary | ICD-10-CM

## 2022-11-03 DIAGNOSIS — Z1239 Encounter for other screening for malignant neoplasm of breast: Secondary | ICD-10-CM

## 2022-11-03 MED ORDER — GADOPICLENOL 0.5 MMOL/ML IV SOLN
7.0000 mL | Freq: Once | INTRAVENOUS | Status: AC | PRN
  Administered 2022-11-03: 7 mL via INTRAVENOUS

## 2022-11-04 ENCOUNTER — Encounter: Payer: Self-pay | Admitting: Family Medicine

## 2022-11-04 DIAGNOSIS — N6310 Unspecified lump in the right breast, unspecified quadrant: Secondary | ICD-10-CM | POA: Insufficient documentation

## 2022-11-10 LAB — FUNGUS CULTURE, BLOOD

## 2022-11-11 ENCOUNTER — Other Ambulatory Visit: Payer: Self-pay | Admitting: Family Medicine

## 2022-11-11 DIAGNOSIS — B351 Tinea unguium: Secondary | ICD-10-CM

## 2022-12-03 ENCOUNTER — Ambulatory Visit: Payer: No Typology Code available for payment source

## 2023-01-07 ENCOUNTER — Ambulatory Visit: Payer: No Typology Code available for payment source

## 2023-01-25 ENCOUNTER — Inpatient Hospital Stay (HOSPITAL_COMMUNITY)
Admission: EM | Admit: 2023-01-25 | Discharge: 2023-02-01 | DRG: 388 | Disposition: A | Discharge status: Home / Self Care | Payer: BC Managed Care – PPO | Attending: Family Medicine | Admitting: Family Medicine

## 2023-01-25 ENCOUNTER — Other Ambulatory Visit: Payer: Self-pay

## 2023-01-25 ENCOUNTER — Encounter: Payer: Self-pay | Admitting: Family Medicine

## 2023-01-25 ENCOUNTER — Observation Stay (HOSPITAL_COMMUNITY): Payer: BC Managed Care – PPO

## 2023-01-25 ENCOUNTER — Ambulatory Visit
Admission: RE | Admit: 2023-01-25 | Discharge: 2023-01-25 | Disposition: A | Discharge status: Home / Self Care | Payer: BC Managed Care – PPO | Source: Ambulatory Visit | Attending: Family Medicine | Admitting: Family Medicine

## 2023-01-25 ENCOUNTER — Telehealth: Payer: Self-pay

## 2023-01-25 ENCOUNTER — Emergency Department (HOSPITAL_COMMUNITY): Payer: BC Managed Care – PPO

## 2023-01-25 ENCOUNTER — Encounter (HOSPITAL_COMMUNITY): Payer: Self-pay

## 2023-01-25 ENCOUNTER — Telehealth: Payer: Self-pay | Admitting: Family Medicine

## 2023-01-25 ENCOUNTER — Ambulatory Visit (INDEPENDENT_AMBULATORY_CARE_PROVIDER_SITE_OTHER): Payer: BC Managed Care – PPO | Admitting: Family Medicine

## 2023-01-25 VITALS — BP 136/67 | HR 86 | Temp 98.2°F | Ht 61.0 in | Wt 148.1 lb

## 2023-01-25 DIAGNOSIS — R109 Unspecified abdominal pain: Secondary | ICD-10-CM | POA: Diagnosis not present

## 2023-01-25 DIAGNOSIS — R918 Other nonspecific abnormal finding of lung field: Secondary | ICD-10-CM

## 2023-01-25 DIAGNOSIS — J189 Pneumonia, unspecified organism: Secondary | ICD-10-CM | POA: Diagnosis present

## 2023-01-25 DIAGNOSIS — Z91014 Allergy to mammalian meats: Secondary | ICD-10-CM

## 2023-01-25 DIAGNOSIS — Z841 Family history of disorders of kidney and ureter: Secondary | ICD-10-CM

## 2023-01-25 DIAGNOSIS — K56600 Partial intestinal obstruction, unspecified as to cause: Secondary | ICD-10-CM | POA: Diagnosis not present

## 2023-01-25 DIAGNOSIS — Z9889 Other specified postprocedural states: Secondary | ICD-10-CM

## 2023-01-25 DIAGNOSIS — K56609 Unspecified intestinal obstruction, unspecified as to partial versus complete obstruction: Secondary | ICD-10-CM | POA: Diagnosis not present

## 2023-01-25 DIAGNOSIS — Z79899 Other long term (current) drug therapy: Secondary | ICD-10-CM

## 2023-01-25 DIAGNOSIS — E861 Hypovolemia: Secondary | ICD-10-CM | POA: Diagnosis present

## 2023-01-25 DIAGNOSIS — E876 Hypokalemia: Secondary | ICD-10-CM | POA: Diagnosis present

## 2023-01-25 DIAGNOSIS — E871 Hypo-osmolality and hyponatremia: Secondary | ICD-10-CM | POA: Insufficient documentation

## 2023-01-25 DIAGNOSIS — Z90711 Acquired absence of uterus with remaining cervical stump: Secondary | ICD-10-CM

## 2023-01-25 DIAGNOSIS — R1110 Vomiting, unspecified: Secondary | ICD-10-CM | POA: Diagnosis not present

## 2023-01-25 DIAGNOSIS — Z83438 Family history of other disorder of lipoprotein metabolism and other lipidemia: Secondary | ICD-10-CM

## 2023-01-25 DIAGNOSIS — R0682 Tachypnea, not elsewhere classified: Secondary | ICD-10-CM | POA: Diagnosis present

## 2023-01-25 DIAGNOSIS — K8020 Calculus of gallbladder without cholecystitis without obstruction: Secondary | ICD-10-CM | POA: Diagnosis present

## 2023-01-25 HISTORY — DX: Other nonspecific abnormal finding of lung field: R91.8

## 2023-01-25 LAB — COMPREHENSIVE METABOLIC PANEL
ALT: 26 U/L (ref 0–44)
AST: 34 U/L (ref 15–41)
Albumin: 3.8 g/dL (ref 3.5–5.0)
Alkaline Phosphatase: 85 U/L (ref 38–126)
Anion gap: 9 (ref 5–15)
BUN: 33 mg/dL — ABNORMAL HIGH (ref 6–20)
CO2: 33 mmol/L — ABNORMAL HIGH (ref 22–32)
Calcium: 9.3 mg/dL (ref 8.9–10.3)
Chloride: 86 mmol/L — ABNORMAL LOW (ref 98–111)
Creatinine, Ser: 0.71 mg/dL (ref 0.44–1.00)
GFR, Estimated: >60 mL/min (ref >60)
Glucose, Bld: 117 mg/dL — ABNORMAL HIGH (ref 70–99)
Potassium: 4.3 mmol/L (ref 3.5–5.1)
Sodium: 128 mmol/L — ABNORMAL LOW (ref 135–145)
Total Bilirubin: 1.9 mg/dL — ABNORMAL HIGH (ref 0.3–1.2)
Total Protein: 7.4 g/dL (ref 6.5–8.1)

## 2023-01-25 LAB — I-STAT CHEM 8, ED
BUN: 43 mg/dL — ABNORMAL HIGH (ref 6–20)
Calcium, Ion: 0.94 mmol/L — ABNORMAL LOW (ref 1.15–1.40)
Chloride: 92 mmol/L — ABNORMAL LOW (ref 98–111)
Creatinine, Ser: 0.7 mg/dL (ref 0.44–1.00)
Glucose, Bld: 118 mg/dL — ABNORMAL HIGH (ref 70–99)
HCT: 48 % — ABNORMAL HIGH (ref 36.0–46.0)
Hemoglobin: 16.3 g/dL — ABNORMAL HIGH (ref 12.0–15.0)
Potassium: 4.4 mmol/L (ref 3.5–5.1)
Sodium: 130 mmol/L — ABNORMAL LOW (ref 135–145)
TCO2: 36 mmol/L — ABNORMAL HIGH (ref 22–32)

## 2023-01-25 LAB — LIPASE, BLOOD: Lipase: 28 U/L (ref 11–51)

## 2023-01-25 LAB — CBC
HCT: 44.7 % (ref 36.0–46.0)
Hemoglobin: 15.5 g/dL — ABNORMAL HIGH (ref 12.0–15.0)
MCH: 32.1 pg (ref 26.0–34.0)
MCHC: 34.7 g/dL (ref 30.0–36.0)
MCV: 92.5 fL (ref 80.0–100.0)
Platelets: 263 10*3/uL (ref 150–400)
RBC: 4.83 MIL/uL (ref 3.87–5.11)
RDW: 11.9 % (ref 11.5–15.5)
WBC: 10.1 10*3/uL (ref 4.0–10.5)
nRBC: 0 % (ref 0.0–0.2)

## 2023-01-25 LAB — LACTIC ACID, PLASMA
Lactic Acid, Venous: 2.3 mmol/L (ref 0.5–1.9)
Lactic Acid, Venous: 1.8 mmol/L (ref 0.5–1.9)

## 2023-01-25 MED ORDER — ONDANSETRON HCL 4 MG/2ML IJ SOLN
4.0000 mg | Freq: Once | INTRAMUSCULAR | Status: AC
Start: 2023-01-25 — End: 2023-01-25
  Administered 2023-01-25: 4 mg via INTRAVENOUS
  Filled 2023-01-25: qty 2

## 2023-01-25 MED ORDER — MORPHINE SULFATE (PF) 4 MG/ML IV SOLN
4.0000 mg | Freq: Once | INTRAVENOUS | Status: AC
  Administered 2023-01-25: 4 mg via INTRAVENOUS
  Filled 2023-01-25: qty 1

## 2023-01-25 MED ORDER — FAMOTIDINE 20 MG PO TABS: 20.0000 mg | ORAL_TABLET | Freq: Two times a day (BID) | ORAL | 0 refills | Status: DC

## 2023-01-25 MED ORDER — ACETAMINOPHEN 10 MG/ML IV SOLN: 1000.0000 mg | Freq: Four times a day (QID) | INTRAVENOUS | Status: AC | PRN

## 2023-01-25 MED ORDER — SODIUM CHLORIDE 0.9 % IV SOLN
1.0000 g | INTRAVENOUS | Status: DC
  Administered 2023-01-25: 1 g via INTRAVENOUS
  Filled 2023-01-25: qty 10

## 2023-01-25 MED ORDER — DEXTROSE 5 % IV SOLN
250.0000 mg | INTRAVENOUS | Status: DC
  Administered 2023-01-26 – 2023-01-27 (×2): 250 mg via INTRAVENOUS
  Filled 2023-01-25 (×3): qty 2.5

## 2023-01-25 MED ORDER — SODIUM CHLORIDE 0.9 % IV BOLUS
1000.0000 mL | Freq: Once | INTRAVENOUS | Status: AC
  Administered 2023-01-25: 1000 mL via INTRAVENOUS

## 2023-01-25 MED ORDER — IOHEXOL 350 MG/ML SOLN
75.0000 mL | Freq: Once | INTRAVENOUS | Status: AC | PRN
  Administered 2023-01-25: 75 mL via INTRAVENOUS

## 2023-01-25 MED ORDER — SODIUM CHLORIDE 0.9 % IV SOLN: INTRAVENOUS | Status: DC

## 2023-01-25 MED ORDER — SODIUM CHLORIDE 0.9 % IV SOLN
500.0000 mg | Freq: Once | INTRAVENOUS | Status: AC
  Administered 2023-01-25: 500 mg via INTRAVENOUS
  Filled 2023-01-25: qty 5

## 2023-01-25 NOTE — Telephone Encounter (Signed)
Patient calls nurse line requesting an apt.   She reports nausea and vomiting since Friday. She reports abdominal pain and distention in the "front of my belly."   She denies any diarrhea or fevers. No bloody emesis.   Patient scheduled for evaluation this morning.   Precautions discussed in the meantime.

## 2023-01-25 NOTE — Assessment & Plan Note (Signed)
Patient presents with 3 days of nausea and vomiting with significant abdominal pain.  Has been able to have bowel movements since symptoms started and has been able to maintain hydration adequately.  Patient does have a history of C-sections, partial hysterectomy and oophorectomy x 1.  Does not seem consistent with gastroenteritis picture unless patient begins having diarrhea.  Differentials include partial SBO, significant constipation, gallbladder etiology.  Reassuringly patient does not have any fevers, but given the pain with examination and the history of abdominal surgeries do think that ruling out SBO is the priority. - KUB ordered - If KUB negative for SBO signs, will start with MiraLAX and senna with plans for close follow-up if not improving - Patient declined Zofran at this time - Pepcid prescribed to hopefully help with some symptom relief from nausea

## 2023-01-25 NOTE — ED Notes (Signed)
Patient transported to CT 

## 2023-01-25 NOTE — Hospital Course (Addendum)
Madison Booker is a 62 y.o.female with a history of C-section and abdominal hysterectomy who was admitted to the family medicine teaching Service at [facility] for small bowel obstruction. Her hospital course is detailed below:  Small Bowel Obstruction Patient admitted after presenting to PCP with three days of nausea/vomiting and bloating. Found to have marked small bowel dilatation on KUB obtained as an outpatient so was directed to ED for possible SBO. Imaging in the ED confirmed high-grade SBO. NG was placed in the ED and general surgery was consulted.  Throughout admission neurosurgery watch closely and recommended conservative management.  Patient clinically improved and NGT was removed on 3/8.  Patient tolerated well and began clear liquid diet.  At discharge patient was having bowel movements and tolerating a soft diet  Community Acquired Pneumonia Imaging findings noted on CT Abdomen/Pelvis consistent with a RLL pneumonia. Initially thought to be an incidental finding but patient became increasingly tachycardic and tachypneic so CAP coverage was added with ceftriaxone and azithromycin.  Patient's vitals resolved to normal and blood cultures drawn in the ED were negative for greater than 48 hours.  Ceftriaxone and azithromycin discontinued.  Patient remained afebrile and stable on room air until discharge.  Items for PCP follow-up Recommend repeat 2-view CXR in 4-6 weeks to ensure resolution of RLL pneumonia.  Recheck BMP at follow up, patient hyponatremic during hospitalization.

## 2023-01-25 NOTE — Progress Notes (Signed)
    SUBJECTIVE:   CHIEF COMPLAINT / HPI:   Nausea and vomiting - Present since Friday 3/1  - Has had 2 poops since getting sick but no diarrhea - Not around anyone else who has been sick, no other sick symptoms - Not taking anything for the belly pain - Has vomited 9 times since getting sick - Has been able to keep down water - Having to sleep sitting up because of the pain and the nausea - Pain is not related to eating  - Hx of C-sections, partial hysterectomy, oophrectomy   PERTINENT  PMH / PSH: Reviewed  OBJECTIVE:   BP 136/67   Pulse 86   Temp 98.2 F (36.8 C) (Oral)   Ht '5\' 1"'$  (1.549 m)   Wt 148 lb 2 oz (67.2 kg)   SpO2 100%   BMI 27.99 kg/m   Gen: well-appearing, NAD CV: RRR, no m/r/g appreciated, no peripheral edema Pulm: CTAB, no wheezes/crackles GI: Diffuse tenderness, mild distention, nontympanic, bowel sounds present, no peritoneal signs  ASSESSMENT/PLAN:   Abdominal pain with vomiting and history of abdominal surgery Patient presents with 3 days of nausea and vomiting with significant abdominal pain.  Has been able to have bowel movements since symptoms started and has been able to maintain hydration adequately.  Patient does have a history of C-sections, partial hysterectomy and oophorectomy x 1.  Does not seem consistent with gastroenteritis picture unless patient begins having diarrhea.  Differentials include partial SBO, significant constipation, gallbladder etiology.  Reassuringly patient does not have any fevers, but given the pain with examination and the history of abdominal surgeries do think that ruling out SBO is the priority. - KUB ordered - If KUB negative for SBO signs, will start with MiraLAX and senna with plans for close follow-up if not improving - Patient declined Zofran at this time - Pepcid prescribed to hopefully help with some symptom relief from nausea     Madison Booker, [place]

## 2023-01-25 NOTE — Telephone Encounter (Signed)
Called patient regarding x-ray results which show severe small bowel dilation with concern for distal small bowel obstruction. Discussed with Dr. Thompson Grayer and patient regarding the next steps and recommended that patient go to the ER for possible admission.  ER charge nurse made aware.   Madison Arthurs, DO

## 2023-01-25 NOTE — Assessment & Plan Note (Signed)
Sodium 138, improved status post initiation of IV fluid rehydration. - Repeat CMP in am

## 2023-01-25 NOTE — Assessment & Plan Note (Addendum)
Respiratory status stable. -blood cultures NGTD 4 days -Follow-up outpatient CXR

## 2023-01-25 NOTE — Assessment & Plan Note (Addendum)
Tolerating clears well.  Abdomen soft and nontender, bowel sounds appreciated on exam.  Continues to have stable vitals and reassuring abdominal exam.  Continue co-management with General Surgery. Pain well-controlled and has not required any as needed morphine in the past 24 hours. - Gen surg following, appreciate care -Will likely attempt advancing diet, although leaving this up to general surgery.  Attempting clears with NGT clamping trial - D5NS at 85 mL/hr - IV morphine PRN for pain  - Replete electrolytes as needed

## 2023-01-25 NOTE — Plan of Care (Signed)

## 2023-01-25 NOTE — ED Triage Notes (Signed)
Pt reports 1 week of abd pain, bloating and n/v. Pt reports last BM was last night but is not currently passing any gas. Pt sent by PCP for further eval.

## 2023-01-25 NOTE — H&P (Cosign Needed Addendum)
Hospital Admission History and Physical Service Pager: [phone]  Patient name: Madison Booker Medical record number: [id number] Date of Birth: 02/14/1961 Age: 62 y.o. Gender: female  Primary Care Provider: Kinnie Feil, MD Consultants: General Surgery Code Status: FULL which was confirmed with family if patient unable to confirm  Preferred Emergency Contact:  Contact Information     Name Relation Home Work [place], [place] Spouse   [phone]      Chief Complaint: Nausea and Vomiting  Assessment and Plan: LAKEISA TOWNS is a 62 y.o. female presenting with n/v and bloating x3 days with imaging findings consistent with SBO.  * Small bowel obstruction ([place]) General surgery contacted by the EDP, will see tonight. Admitted for management. Pain is 1-2/10 s/p morphine in the ED. Abdomen distended but without periotoneal signs.  - Admit to med-surg - Gen surg to see, appreciate their assistance - NG to low intermittent suction - NPO - NS at 122m/hr - IV Tylenol PRN for pain   Abnormal CT scan of lung Radiology report read as PNA of RLL. Imaging findings reviewed and are certainly suspicious, however, patient has been afebrile and without cough or sputum production. Satting well on room air. Intermittently tachypneic but seems to be associated with pain. No white count. Will hold off on abx. May have some degree of atelectasis from abdominal distention, will try incentive spirometer for alveolar recruitment.  - Low threshold to add abx coverage should symptoms develop - Repeat CXR in 4-6 weeks  - IS   Cholelithiasis Bili 1.9. No real symptoms of biliary pain. Likely an incidental finding on imaging. - Continue to monitor.  - Repeat CMP in am  Hyponatremia Hypovolemic on exam. Suspect 2/2 poor PO intake in the setting of symptomatic SBO as above. - Giving NS bolus and maintenance fluids - Repeat CMP in am   FEN/GI: NPO VTE Prophylaxis: SCDs, [place] risk  score of 0.   Disposition: home pending clinical course   History of Present Illness:  Madison BRATTAINis a 62y.o. female with history of abdominal surgery (C-sections, partial hysterectomy, and oopherectomy for ovarian torsion) who presents with nausea and vomiting.   Starting feeling bloated on Friday then developed nausea and dry heaving that progressed to vomiting this morning. Has had 2 BM but reports they have been small. Reported 9/10 pain and bloating. Has vomited multiple times and having trouble with sitting up. Has a history of abdominal surgery > 30 years ago including C section, partial hysterectomy, oophorectomy. Denies any recent fever, chills. Denies any SOB.    In the ED, lactate downtrending 2.3>1.2. CBC stable. BMP with Na 128. Received morphine, zofran, IVF bolus. CTAP showed high-grade SBO. General surgery consulted by EDP.   Review Of Systems: Per HPI with the following additions: see above   Pertinent Past Medical History: Hyperlipidemia  Remainder reviewed in history tab.   Pertinent Past Surgical History: C-sections  Partial hysterectomy Oophorectomy    Remainder reviewed in history tab.   Pertinent Social History: Tobacco use: None Alcohol use: Denies  Other Substance use: Denies  Lives with husband  Works as a pSoftware engineer  Pertinent Family History: N/A  Remainder reviewed in history tab.   Important Outpatient Medications: None  Remainder reviewed in medication history.   Objective: BP (!) 127/58   Pulse 93   Temp 98.1 F (36.7 C) (Oral)   Resp (!) 25   Ht '5\' 6"'$  (1.676 m)  Wt 65.8 kg   SpO2 95%   BMI 23.40 kg/m  Exam: General: in no acute distress.  ENTM: dry mucous membranes  Cardiovascular: regular rate and rhythm Respiratory: slightly decreased in RLL. Otherwise CTAB. Normal WOB on RA.  Gastrointestinal: Distended and hypertympanic. Diffusely tender but without rebound or guarding. Neuro: alert and oriented to situation.  Answering questions appropriately.  Psych: Stable mood and affect.   Labs:  CBC BMET  Recent Labs  Lab 01/25/23 1504 01/25/23 1525  WBC 10.1  --   HGB 15.5* 16.3*  HCT 44.7 48.0*  PLT 263  --    Recent Labs  Lab 01/25/23 1504 01/25/23 1525  NA 128* 130*  K 4.3 4.4  CL 86* 92*  CO2 33*  --   BUN 33* 43*  CREATININE 0.71 0.70  GLUCOSE 117* 118*  CALCIUM 9.3  --     Pertinent additional labs. Lipase 28. Lactic acid 1.8>2.3.    Imaging Studies Performed: CTAP  IMPRESSION: 1. High-grade small-bowel obstruction with transition point in the lower central abdomen. 2. Small amount of free fluid in the abdomen and pelvis. 3. Right lower lobe pneumonia. Follow-up PA and lateral chest x-ray recommended in 4-6 weeks to confirm resolution. 4. Cholelithiasis.   Madison Lundborg, MD 01/25/2023, 8:37 PM PGY-1, [place] Intern pager: [phone], text pages welcome Secure chat group [place]     I have evaluated this patient along with Dr. Joelyn Oms and reviewed the above note, making necessary revisions.  Madison Dubonnet, MD 01/25/2023, 8:52 PM PGY-2, [place]

## 2023-01-25 NOTE — ED Notes (Signed)
ED TO INPATIENT HANDOFF REPORT  ED Nurse Name and Phone #: [phone] Carron Curie, RN  S Name/Age/Gender Madison Booker 62 y.o. female Room/Bed: 016C/016C  Code Status   Code Status: Full Code  Home/SNF/Other Home Patient oriented to: self, place, time, and situation Is this baseline? Yes   Triage Complete: Triage complete  Chief Complaint Small bowel obstruction ([place]) [id number]  Triage Note Pt reports 1 week of abd pain, bloating and n/v. Pt reports last BM was last night but is not currently passing any gas. Pt sent by PCP for further eval.    Allergies No Known Allergies  Level of Care/Admitting Diagnosis ED Disposition     ED Disposition  Admit   Condition  --   [place]: [place] [id number]  Level of Care: Med-Surg [16]  May place patient in observation at [facility] or [place] if equivalent level of care is available:: No  Covid Evaluation: Confirmed COVID Negative  Diagnosis: [id number]  Admitting Physician: Eppie Gibson [id number]  Attending Physician: Dickie La [[id number]]          B Medical/Surgery History Past Medical History:  Diagnosis Date   H/O abdominal hysterectomy 11/30/1991   Past Surgical History:  Procedure Laterality Date   [place]     A IV Location/Drains/Wounds Patient Lines/Drains/Airways Status     Active Line/Drains/Airways     Name Placement date Placement time Site Days   Peripheral IV 01/25/23 20 G Left Antecubital 01/25/23  1504  Antecubital  less than 1   NG/OG Vented/Dual Lumen 15 Fr. Right nare 65 cm 01/25/23  2055  Right nare  less than 1            Intake/Output Last 24 hours  Intake/Output Summary (Last 24 hours) at 01/25/2023 2114 Last data filed at 01/25/2023 1827 Gross per 24 hour  Intake 1000 ml  Output --  Net 1000 ml    Labs/Imaging Results for orders placed or performed during the  hospital encounter of 01/25/23 (from the past 48 hour(s))  Lipase, blood     Status: None   Collection Time: 01/25/23  3:04 PM  Result Value Ref Range   Lipase 28 11 - 51 U/L    Comment: Performed at [facility], [place] [address] [place], [place] [address]  Comprehensive metabolic panel     Status: Abnormal   Collection Time: 01/25/23  3:04 PM  Result Value Ref Range   Sodium 128 (L) 135 - 145 mmol/L   Potassium 4.3 3.5 - 5.1 mmol/L    Comment: HEMOLYSIS AT THIS LEVEL MAY AFFECT RESULT   Chloride 86 (L) 98 - 111 mmol/L   CO2 33 (H) 22 - 32 mmol/L   Glucose, Bld 117 (H) 70 - 99 mg/dL    Comment: Glucose reference range applies only to samples taken after fasting for at least 8 hours.   BUN 33 (H) 6 - 20 mg/dL   Creatinine, Ser 0.71 0.44 - 1.00 mg/dL   Calcium 9.3 8.9 - 10.3 mg/dL   Total Protein 7.4 6.5 - 8.1 g/dL   Albumin 3.8 3.5 - 5.0 g/dL   AST 34 15 - 41 U/L    Comment: HEMOLYSIS AT THIS LEVEL MAY AFFECT RESULT   ALT 26 0 - 44 U/L    Comment: HEMOLYSIS AT THIS LEVEL MAY AFFECT RESULT   Alkaline Phosphatase 85 38 -  126 U/L   Total Bilirubin 1.9 (H) 0.3 - 1.2 mg/dL    Comment: HEMOLYSIS AT THIS LEVEL MAY AFFECT RESULT   GFR, Estimated >60 >60 mL/min    Comment: (NOTE) Calculated using the CKD-EPI Creatinine Equation (2021)    Anion gap 9 5 - 15    Comment: Performed at [facility] [address], [place], [place] [address]  CBC     Status: Abnormal   Collection Time: 01/25/23  3:04 PM  Result Value Ref Range   WBC 10.1 4.0 - 10.5 K/uL   RBC 4.83 3.87 - 5.11 MIL/uL   Hemoglobin 15.5 (H) 12.0 - 15.0 g/dL   HCT 44.7 36.0 - 46.0 %   MCV 92.5 80.0 - 100.0 fL   MCH 32.1 26.0 - 34.0 pg   MCHC 34.7 30.0 - 36.0 g/dL   RDW 11.9 11.5 - 15.5 %   Platelets 263 150 - 400 K/uL   nRBC 0.0 0.0 - 0.2 %    Comment: Performed at [facility], [place] [address], [place], [place] [address]  I-stat chem 8, ED (not at [facility], [facility] or [facility])     Status: Abnormal   Collection  Time: 01/25/23  3:25 PM  Result Value Ref Range   Sodium 130 (L) 135 - 145 mmol/L   Potassium 4.4 3.5 - 5.1 mmol/L   Chloride 92 (L) 98 - 111 mmol/L   BUN 43 (H) 6 - 20 mg/dL   Creatinine, Ser 0.70 0.44 - 1.00 mg/dL   Glucose, Bld 118 (H) 70 - 99 mg/dL    Comment: Glucose reference range applies only to samples taken after fasting for at least 8 hours.   Calcium, Ion 0.94 (L) 1.15 - 1.40 mmol/L   TCO2 36 (H) 22 - 32 mmol/L   Hemoglobin 16.3 (H) 12.0 - 15.0 g/dL   HCT 48.0 (H) 36.0 - 46.0 %  Lactic acid, plasma     Status: Abnormal   Collection Time: 01/25/23  3:45 PM  Result Value Ref Range   Lactic Acid, Venous 2.3 (HH) 0.5 - 1.9 mmol/L    Comment: CRITICAL RESULT CALLED TO, READ BACK BY AND VERIFIED WITH Fayrene Fearing, RN @ [phone] 01/25/23 BY [facility] Performed at [facility], [place] [address] [place], [place] [address]   Lactic acid, plasma     Status: None   Collection Time: 01/25/23  6:50 PM  Result Value Ref Range   Lactic Acid, Venous 1.8 0.5 - 1.9 mmol/L    Comment: Performed at [facility] [address] [place], [place] [address]   CT ABDOMEN PELVIS W CONTRAST  Result Date: 01/25/2023 CLINICAL DATA:  Abdominal pain EXAM: CT ABDOMEN AND PELVIS WITH CONTRAST TECHNIQUE: Multidetector CT imaging of the abdomen and pelvis was performed using the standard protocol following bolus administration of intravenous contrast. RADIATION DOSE REDUCTION: This exam was performed according to the departmental dose-optimization program which includes automated exposure control, adjustment of the mA and/or kV according to patient size and/or use of iterative reconstruction technique. CONTRAST:  62m OMNIPAQUE IOHEXOL 350 MG/ML SOLN COMPARISON:  Abdominal x-ray same day FINDINGS: Lower chest: Patchy airspace and ground-glass opacities are present in the right lower lobe compatible pneumonia. There is a trace right pleural effusion. Hepatobiliary: Gallstones are present. There is no biliary  ductal dilatation. The liver is within normal limits. Pancreas: Unremarkable. No pancreatic ductal dilatation or surrounding inflammatory changes. Spleen: Normal in size without focal abnormality. Adrenals/Urinary Tract:  Adrenal glands are unremarkable. Kidneys are normal, without renal calculi, focal lesion, or hydronephrosis. Bladder is unremarkable. Stomach/Bowel: There is high-grade small-bowel obstruction. Transition point is seen in the lower central abdomen coronal image 6/40. Small left proximal to this level is dilated with air-fluid levels measuring up 2 4.3 cm. Additionally the stomach is markedly dilated with air-fluid level. Distal small bowel and colon are decompressed. The appendix is not visualized there is no pneumatosis, free air or focal wall thickening. Vascular/Lymphatic: No significant vascular findings are present. No enlarged abdominal or pelvic lymph nodes. Reproductive: Status post hysterectomy. No adnexal masses. Other: There is a small amount of free fluid in the right upper quadrant and pelvis. Musculoskeletal: No acute or significant osseous findings. IMPRESSION: 1. High-grade small-bowel obstruction with transition point in the lower central abdomen. 2. Small amount of free fluid in the abdomen and pelvis. 3. Right lower lobe pneumonia. Follow-up PA and lateral chest x-ray recommended in 4-6 weeks to confirm resolution. 4. Cholelithiasis. Electronically Signed   By: Ronney Asters M.D.   On: 01/25/2023 19:07   DG Abd 1 View  Result Date: 01/25/2023 CLINICAL DATA:  Abdominal pain, vomiting. EXAM: ABDOMEN - 1 VIEW COMPARISON:  None Available. FINDINGS: Severely dilated small bowel loops are noted without colonic dilatation, concerning for distal small bowel obstruction. Surgical clips are noted in the pelvis. Cholelithiasis is noted in right upper quadrant. IMPRESSION: Severe small bowel dilatation is noted concerning for distal small bowel obstruction. Cholelithiasis. Electronically  Signed   By: Marijo Conception M.D.   On: 01/25/2023 12:30    Pending Labs Unresulted Labs (From admission, onward)     Start     Ordered   01/26/23 0500  Comprehensive metabolic panel  Tomorrow morning,   R        01/25/23 2028   01/26/23 0500  CBC  Tomorrow morning,   R        01/25/23 2028   01/25/23 2028  HIV Antibody (routine testing w rflx)  (HIV Antibody (Routine testing w reflex) panel)  Once,   R        01/25/23 2028   01/25/23 1458  Urinalysis, Routine w reflex microscopic -Urine, Clean Catch  Once,   URGENT       Question:  Specimen Source  Answer:  Urine, Clean Catch   01/25/23 1457            Vitals/Pain Today's Vitals   01/25/23 1830 01/25/23 1900 01/25/23 1916 01/25/23 2100  BP: 132/62 (!) 127/58  (!) 138/43  Pulse: 90 93  (!) 110  Resp: (!) 26 (!) 25  (!) 27  Temp:   98.1 F (36.7 C)   TempSrc:   Oral   SpO2: 95% 95%  90%  Weight:      Height:      PainSc:        Isolation Precautions No active isolations  Medications Medications  0.9 %  sodium chloride infusion (has no administration in time range)  acetaminophen (OFIRMEV) IV 1,000 mg (has no administration in time range)  sodium chloride 0.9 % bolus 1,000 mL (has no administration in time range)  morphine (PF) 4 MG/ML injection 4 mg (4 mg Intravenous Given 01/25/23 1539)  ondansetron (ZOFRAN) injection 4 mg (4 mg Intravenous Given 01/25/23 1539)  sodium chloride 0.9 % bolus 1,000 mL (0 mLs Intravenous Stopped 01/25/23 1827)  iohexol (OMNIPAQUE) 350 MG/ML injection 75 mL (75 mLs Intravenous Contrast Given 01/25/23 1849)  Mobility walks     Focused Assessments GI   R Recommendations: See Admitting Provider Note  Report given to:   Additional Notes: PT IS A&O X4 - PT NOW HAS NG TUBE - VERY PLEASANT

## 2023-01-25 NOTE — Assessment & Plan Note (Deleted)
Bili improved to 1.1. - Continue to monitor.  - Repeat CMP in am

## 2023-01-25 NOTE — ED Provider Notes (Signed)
[place] Provider Note   CSN: [id number] Arrival date & time: 01/25/23  1450     History  Chief Complaint  Patient presents with   Abdominal Pain   Bloated    Madison Booker is a 62 y.o. female.  The history is provided by the patient and medical records. No language interpreter was used.  Abdominal Pain Pain location:  Generalized Pain quality: aching, bloating, cramping and dull   Pain quality: not sharp   Pain radiates to:  Does not radiate Pain severity:  Severe Onset quality:  Gradual Duration:  4 days Timing:  Constant Progression:  Waxing and waning Chronicity:  New Context: previous surgery   Relieved by:  Nothing Worsened by:  Palpation Ineffective treatments:  None tried Associated symptoms: constipation, flatus (lack of), nausea and vomiting   Associated symptoms: no chest pain, no chills, no cough, no diarrhea, no dysuria, no fatigue, no fever and no shortness of breath        Home Medications Prior to Admission medications   Medication Sig Start Date End Date Taking? Authorizing Provider  ciclopirox (PENLAC) 8 % solution Apply topically at bedtime. Apply over nail and surrounding skin. Apply daily over previous coat. After seven (7) days, may remove with alcohol and continue cycle. 10/13/22   Kinnie Feil, MD  famotidine (PEPCID) 20 MG tablet Take 1 tablet (20 mg total) by mouth 2 (two) times daily. 01/25/23   Rise Patience, DO  Zoster Vaccine Adjuvanted [facility]) injection Please administer 2nd dose 2 months after the initial 10/13/22   Kinnie Feil, MD      Allergies    Patient has no known allergies.    Review of Systems   Review of Systems  Constitutional:  Negative for chills, fatigue and fever.  HENT:  Negative for congestion.   Respiratory:  Negative for cough, chest tightness and shortness of breath.   Cardiovascular:  Negative for chest pain, palpitations and leg swelling.   Gastrointestinal:  Positive for abdominal distention, abdominal pain, constipation, flatus (lack of), nausea and vomiting. Negative for anal bleeding, blood in stool, diarrhea and rectal pain.  Genitourinary:  Positive for decreased urine volume. Negative for dysuria, flank pain and frequency.  Musculoskeletal:  Negative for back pain and neck pain.  Skin:  Negative for rash and wound.  Neurological:  Negative for light-headedness, numbness and headaches.  Psychiatric/Behavioral:  Negative for agitation and confusion.   All other systems reviewed and are negative.   Physical Exam Updated Vital Signs BP 128/76 (BP Location: Right Arm)   Pulse 88   Temp 98.2 F (36.8 C) (Oral)   Resp 18   Ht '5\' 6"'$  (1.676 m)   Wt 65.8 kg   SpO2 98%   BMI 23.40 kg/m  Physical Exam Vitals and nursing note reviewed.  Constitutional:      General: She is not in acute distress.    Appearance: She is well-developed. She is not ill-appearing, toxic-appearing or diaphoretic.  HENT:     Head: Normocephalic and atraumatic.     Right Ear: External ear normal.     Left Ear: External ear normal.     Nose: Nose normal. No congestion or rhinorrhea.     Mouth/Throat:     Mouth: Mucous membranes are dry.     Pharynx: No oropharyngeal exudate.  Eyes:     Conjunctiva/sclera: Conjunctivae normal.     Pupils: Pupils are equal, round, and reactive to light.  Cardiovascular:     Rate and Rhythm: Normal rate.     Heart sounds: Normal heart sounds. No murmur heard. Pulmonary:     Effort: Pulmonary effort is normal. No respiratory distress.     Breath sounds: Normal breath sounds. No stridor. No wheezing, rhonchi or rales.  Chest:     Chest wall: No tenderness.  Abdominal:     General: A surgical scar is present. Bowel sounds are decreased. There is distension.     Tenderness: There is generalized abdominal tenderness. There is no right CVA tenderness, left CVA tenderness or rebound.  Musculoskeletal:         General: No tenderness or signs of injury.     Cervical back: Normal range of motion and neck supple. No tenderness.  Skin:    General: Skin is warm.     Coloration: Skin is not pale.     Findings: No bruising, erythema or rash.  Neurological:     General: No focal deficit present.     Mental Status: She is alert and oriented to person, place, and time.     Motor: No abnormal muscle tone.     Coordination: Coordination normal.     Deep Tendon Reflexes: Reflexes are normal and symmetric.  Psychiatric:        Mood and Affect: Mood normal.     ED Results / Procedures / Treatments   Labs (all labs ordered are listed, but only abnormal results are displayed) Labs Reviewed  COMPREHENSIVE METABOLIC PANEL - Abnormal; Notable for the following components:      Result Value   Sodium 128 (*)    Chloride 86 (*)    CO2 33 (*)    Glucose, Bld 117 (*)    BUN 33 (*)    Total Bilirubin 1.9 (*)    All other components within normal limits  CBC - Abnormal; Notable for the following components:   Hemoglobin 15.5 (*)    All other components within normal limits  LACTIC ACID, PLASMA - Abnormal; Notable for the following components:   Lactic Acid, Venous 2.3 (*)    All other components within normal limits  I-STAT CHEM 8, ED - Abnormal; Notable for the following components:   Sodium 130 (*)    Chloride 92 (*)    BUN 43 (*)    Glucose, Bld 118 (*)    Calcium, Ion 0.94 (*)    TCO2 36 (*)    Hemoglobin 16.3 (*)    HCT 48.0 (*)    All other components within normal limits  LIPASE, BLOOD  LACTIC ACID, PLASMA  URINALYSIS, ROUTINE W REFLEX MICROSCOPIC    EKG None  Radiology CT ABDOMEN PELVIS W CONTRAST  Result Date: 01/25/2023 CLINICAL DATA:  Abdominal pain EXAM: CT ABDOMEN AND PELVIS WITH CONTRAST TECHNIQUE: Multidetector CT imaging of the abdomen and pelvis was performed using the standard protocol following bolus administration of intravenous contrast. RADIATION DOSE REDUCTION: This exam  was performed according to the departmental dose-optimization program which includes automated exposure control, adjustment of the mA and/or kV according to patient size and/or use of iterative reconstruction technique. CONTRAST:  50m OMNIPAQUE IOHEXOL 350 MG/ML SOLN COMPARISON:  Abdominal x-ray same day FINDINGS: Lower chest: Patchy airspace and ground-glass opacities are present in the right lower lobe compatible pneumonia. There is a trace right pleural effusion. Hepatobiliary: Gallstones are present. There is no biliary ductal dilatation. The liver is within normal limits. Pancreas: Unremarkable. No pancreatic ductal dilatation or surrounding inflammatory  changes. Spleen: Normal in size without focal abnormality. Adrenals/Urinary Tract: Adrenal glands are unremarkable. Kidneys are normal, without renal calculi, focal lesion, or hydronephrosis. Bladder is unremarkable. Stomach/Bowel: There is high-grade small-bowel obstruction. Transition point is seen in the lower central abdomen coronal image 6/40. Small left proximal to this level is dilated with air-fluid levels measuring up 2 4.3 cm. Additionally the stomach is markedly dilated with air-fluid level. Distal small bowel and colon are decompressed. The appendix is not visualized there is no pneumatosis, free air or focal wall thickening. Vascular/Lymphatic: No significant vascular findings are present. No enlarged abdominal or pelvic lymph nodes. Reproductive: Status post hysterectomy. No adnexal masses. Other: There is a small amount of free fluid in the right upper quadrant and pelvis. Musculoskeletal: No acute or significant osseous findings. IMPRESSION: 1. High-grade small-bowel obstruction with transition point in the lower central abdomen. 2. Small amount of free fluid in the abdomen and pelvis. 3. Right lower lobe pneumonia. Follow-up PA and lateral chest x-ray recommended in 4-6 weeks to confirm resolution. 4. Cholelithiasis. Electronically Signed    By: Ronney Asters M.D.   On: 01/25/2023 19:07   DG Abd 1 View  Result Date: 01/25/2023 CLINICAL DATA:  Abdominal pain, vomiting. EXAM: ABDOMEN - 1 VIEW COMPARISON:  None Available. FINDINGS: Severely dilated small bowel loops are noted without colonic dilatation, concerning for distal small bowel obstruction. Surgical clips are noted in the pelvis. Cholelithiasis is noted in right upper quadrant. IMPRESSION: Severe small bowel dilatation is noted concerning for distal small bowel obstruction. Cholelithiasis. Electronically Signed   By: Marijo Conception M.D.   On: 01/25/2023 12:30    Procedures Procedures    CRITICAL CARE Performed by: Gwenyth Allegra Vega Withrow Total critical care time: 35 minutes Critical care time was exclusive of separately billable procedures and treating other patients. Critical care was necessary to treat or prevent imminent or life-threatening deterioration. Critical care was time spent personally by me on the following activities: development of treatment plan with patient and/or surrogate as well as nursing, discussions with consultants, evaluation of patient's response to treatment, examination of patient, obtaining history from patient or surrogate, ordering and performing treatments and interventions, ordering and review of laboratory studies, ordering and review of radiographic studies, pulse oximetry and re-evaluation of patient's condition.  Medications Ordered in ED Medications  0.9 %  sodium chloride infusion (has no administration in time range)  morphine (PF) 4 MG/ML injection 4 mg (4 mg Intravenous Given 01/25/23 1539)  ondansetron (ZOFRAN) injection 4 mg (4 mg Intravenous Given 01/25/23 1539)  sodium chloride 0.9 % bolus 1,000 mL (0 mLs Intravenous Stopped 01/25/23 1827)  iohexol (OMNIPAQUE) 350 MG/ML injection 75 mL (75 mLs Intravenous Contrast Given 01/25/23 1849)    ED Course/ Medical Decision Making/ A&P                             Medical Decision  Making Amount and/or Complexity of Data Reviewed Labs: ordered.  Risk Prescription drug management.    Madison Booker is a 62 y.o. female with a past medical history significant for hyperlipidemia, hysterectomy, previous torsion status post unilateral oophorectomy who presents with nausea, vomiting, abdominal pain, distention, absent flatus, decreased bowel movement over the last few days.  According to patient, is all began on Friday when she started having nausea and vomiting.  She reports no blood in her emesis.  She reports she has never had history of constipation but  has not passed any gas for the last 4 days.  She reports she had 2 very small bowel movements that were much smaller than normal over the weekend but otherwise has not had a bowel movement today.  She reports her abdominal pain is severe up to a 9 out of 10 in severity.  She is urinating less as well.  Denies any dysuria.  Denies fevers or chills.  Denies any abdominal trauma.  Denies any chest pain or shortness of breath or palpitations but reports that his heart take deep breath when her abdomen is so distended and painful.  She reports is very bloated.  She was told to come in for evaluation rule out of a bowel obstruction.  Otherwise she denies significant weight loss recently and denies any other complaints.  On exam, lungs clear.  Chest nontender.  Abdomen is quite distended and tense.  Bowel sounds were minimal.  Back and flanks nontender.  Good pulses in extremities.  Legs nontender nonedematous.  Clinically I am somewhat concerned about a bowel obstruction.  We will get labs and give her some pain is and, nausea medicine, and fluids.  Will keep her n.p.o.  Will get CT imaging to rule out obstruction.  Anticipate reassessment after workup to determine disposition.  7:51 PM CT scan returned showing evidence of high-grade small bowel obstruction.  It also showed possible pneumonia.  Given patient's lack of cough fevers or  chills have low suspicion for pneumonia.  Will defer to admitting team as far as antibiotics for that.  General surgery was called and I spoke to Dr. Redmond Pulling who will see patient in consultation overnight.  He recommended NG tube, n.p.o., and admission to medicine.  Patient will be admitted to family medicine.  Patient admitted in stable condition.        Final Clinical Impression(s) / ED Diagnoses Final diagnoses:  Small bowel obstruction ([facility])     Clinical Impression: 1. Small bowel obstruction ([place])     Disposition: Admit  This note was prepared with assistance of Dragon voice recognition software. Occasional wrong-word or sound-a-like substitutions may have occurred due to the inherent limitations of voice recognition software.      Abrish Erny, Gwenyth Allegra, MD 01/25/23 [phone]

## 2023-01-25 NOTE — Progress Notes (Signed)
Patient received on the unit via stretcher Patient arrived with NG-tube Patient alert and oriented x 4 Patient oriented to the unit No further questions from patient at this time Family is also at bedside Bed placed at lowest level, side rail x 3 up, call light, personal belongings, side table are within reach

## 2023-01-25 NOTE — Progress Notes (Addendum)
FMTS Interim Progress Note  S:Assessed patient at bedside due to red MEWS with tachycardia, tachypnea, and O2 sat of 90%.  She feels better s/p NG placement and points to vacuum canister that has already filled with green gastric contents.  NS bag noted to be hanging but not running.   O: BP (!) 138/43   Pulse (!) 110   Temp 98.1 F (36.7 C) (Oral)   Resp (!) 27   Ht '5\' 6"'$  (1.676 m)   Wt 65.8 kg   SpO2 90%   BMI 23.40 kg/m   Gen: Uncomfortable-appearing but NAD HENT: NG tube taped in place, green gastric contents noted in tubing Cardio: Tachycardic, regular Pulm: Comfortably tachypneic Abd: Remains distended Skin: Poor skin turgor  A/P: Patient tachycardic and tachypneic. Uncomfortable s/p NG placement. Suspect discomfort may be playing a role here but low threshold to cover for respiratory infection given imaging findings. - Will re-assess vitals s/p NS bolus. If remains tachy and tachypneic or with borderline O2 sats, will add CAP coverage.  - If persistently tachycardic and hypoxic, will obtain CTA PE to rule out PE   ADDENDUM 2148 On chart review, it appears she did already receive her second NS bolus. Given persistent tachycardia and tachypnea with borderline SpO2 (90-91%) will initiate CAP coverage.  - CTX + IV Azithromycin while NPO - Will obtain blood cx prior to administration of abx   Eppie Gibson, MD 01/25/2023, 9:33 PM PGY-2, [place] Medicine Service pager [phone]

## 2023-01-25 NOTE — ED Provider Triage Note (Signed)
Emergency Medicine Provider Triage Evaluation Note  RAMANI SENN , a 62 y.o. female  was evaluated in triage.  Sent to rule out bowel obstruction.  Had 2 small bowel movements yesterday.  Has not passed gas recently.  History of abdominal hysterectomy, no other abdominal surgeries.  No opioids.  No history of obstruction.  Review of Systems  Positive:  Negative:   Physical Exam  BP 128/76 (BP Location: Right Arm)   Pulse 88   Temp 98.2 F (36.8 C) (Oral)   Resp 18   Ht '5\' 6"'$  (1.676 m)   Wt 65.8 kg   SpO2 98%   BMI 23.40 kg/m  Gen:   Awake, no distress   Resp:  Normal effort  MSK:   Moves extremities without difficulty  Other:    Medical Decision Making  Medically screening exam initiated at 3:04 PM.  Appropriate orders placed.  ZADIE VETRANO was informed that the remainder of the evaluation will be completed by another provider, this initial triage assessment does not replace that evaluation, and the importance of remaining in the ED until their evaluation is complete.     Rhae Hammock, PA-C 01/25/23 1505

## 2023-01-25 NOTE — Patient Instructions (Signed)
The first thing I want to do is go ahead and get an x-ray of your abdomen to make sure there is no signs of a bowel obstruction.  Pending the results of that x-ray, we may decide to do is trial medications to see if getting her bowels moving will help the symptoms.  What I would recommend, is using medications such as Pepcid or Tums if you are having a lot of reflux.  We can also trial Zofran (which helps with nausea), though 1 side effect can be constipation with this medication.  I would recommend if you are still having problems in the next 2 days, that I would come back sooner if everything else looks normal.

## 2023-01-26 ENCOUNTER — Inpatient Hospital Stay (HOSPITAL_COMMUNITY): Payer: BC Managed Care – PPO

## 2023-01-26 ENCOUNTER — Observation Stay (HOSPITAL_COMMUNITY): Payer: BC Managed Care – PPO

## 2023-01-26 DIAGNOSIS — Z83438 Family history of other disorder of lipoprotein metabolism and other lipidemia: Secondary | ICD-10-CM | POA: Diagnosis not present

## 2023-01-26 DIAGNOSIS — K8020 Calculus of gallbladder without cholecystitis without obstruction: Secondary | ICD-10-CM | POA: Diagnosis present

## 2023-01-26 DIAGNOSIS — K56609 Unspecified intestinal obstruction, unspecified as to partial versus complete obstruction: Secondary | ICD-10-CM

## 2023-01-26 DIAGNOSIS — K56600 Partial intestinal obstruction, unspecified as to cause: Secondary | ICD-10-CM | POA: Diagnosis present

## 2023-01-26 DIAGNOSIS — E871 Hypo-osmolality and hyponatremia: Secondary | ICD-10-CM | POA: Diagnosis present

## 2023-01-26 DIAGNOSIS — J189 Pneumonia, unspecified organism: Secondary | ICD-10-CM | POA: Diagnosis present

## 2023-01-26 DIAGNOSIS — Z79899 Other long term (current) drug therapy: Secondary | ICD-10-CM | POA: Diagnosis not present

## 2023-01-26 DIAGNOSIS — Z90711 Acquired absence of uterus with remaining cervical stump: Secondary | ICD-10-CM | POA: Diagnosis not present

## 2023-01-26 DIAGNOSIS — E861 Hypovolemia: Secondary | ICD-10-CM | POA: Diagnosis present

## 2023-01-26 DIAGNOSIS — Z91014 Allergy to mammalian meats: Secondary | ICD-10-CM | POA: Diagnosis not present

## 2023-01-26 DIAGNOSIS — E876 Hypokalemia: Secondary | ICD-10-CM | POA: Diagnosis present

## 2023-01-26 DIAGNOSIS — Z841 Family history of disorders of kidney and ureter: Secondary | ICD-10-CM | POA: Diagnosis not present

## 2023-01-26 DIAGNOSIS — R0682 Tachypnea, not elsewhere classified: Secondary | ICD-10-CM | POA: Diagnosis present

## 2023-01-26 HISTORY — DX: Unspecified intestinal obstruction, unspecified as to partial versus complete obstruction: K56.609

## 2023-01-26 LAB — HIV ANTIBODY (ROUTINE TESTING W REFLEX): HIV Screen 4th Generation wRfx: NONREACTIVE

## 2023-01-26 LAB — COMPREHENSIVE METABOLIC PANEL
ALT: 23 U/L (ref 0–44)
AST: 22 U/L (ref 15–41)
Albumin: 2.9 g/dL — ABNORMAL LOW (ref 3.5–5.0)
Alkaline Phosphatase: 73 U/L (ref 38–126)
Anion gap: 16 — ABNORMAL HIGH (ref 5–15)
BUN: 24 mg/dL — ABNORMAL HIGH (ref 6–20)
CO2: 21 mmol/L — ABNORMAL LOW (ref 22–32)
Calcium: 8.3 mg/dL — ABNORMAL LOW (ref 8.9–10.3)
Chloride: 97 mmol/L — ABNORMAL LOW (ref 98–111)
Creatinine, Ser: 0.81 mg/dL (ref 0.44–1.00)
GFR, Estimated: >60 mL/min (ref >60)
Glucose, Bld: 106 mg/dL — ABNORMAL HIGH (ref 70–99)
Potassium: 3.4 mmol/L — ABNORMAL LOW (ref 3.5–5.1)
Sodium: 134 mmol/L — ABNORMAL LOW (ref 135–145)
Total Bilirubin: 1.3 mg/dL — ABNORMAL HIGH (ref 0.3–1.2)
Total Protein: 5.8 g/dL — ABNORMAL LOW (ref 6.5–8.1)

## 2023-01-26 LAB — CBC
HCT: 38.9 % (ref 36.0–46.0)
Hemoglobin: 13.7 g/dL (ref 12.0–15.0)
MCH: 32.9 pg (ref 26.0–34.0)
MCHC: 35.2 g/dL (ref 30.0–36.0)
MCV: 93.3 fL (ref 80.0–100.0)
Platelets: 193 10*3/uL (ref 150–400)
RBC: 4.17 MIL/uL (ref 3.87–5.11)
RDW: 11.9 % (ref 11.5–15.5)
WBC: 12.8 10*3/uL — ABNORMAL HIGH (ref 4.0–10.5)
nRBC: 0 % (ref 0.0–0.2)

## 2023-01-26 LAB — GLUCOSE, CAPILLARY: Glucose-Capillary: 82 mg/dL (ref 70–99)

## 2023-01-26 MED ORDER — POTASSIUM CHLORIDE 10 MEQ/100ML IV SOLN
10.0000 meq | INTRAVENOUS | Status: AC
  Administered 2023-01-26 (×4): 10 meq via INTRAVENOUS
  Filled 2023-01-26 (×4): qty 100

## 2023-01-26 MED ORDER — PROCHLORPERAZINE EDISYLATE 10 MG/2ML IJ SOLN
10.0000 mg | Freq: Once | INTRAMUSCULAR | Status: AC
  Administered 2023-01-26: 10 mg via INTRAVENOUS
  Filled 2023-01-26: qty 2

## 2023-01-26 MED ORDER — METRONIDAZOLE 500 MG/100ML IV SOLN
500.0000 mg | Freq: Two times a day (BID) | INTRAVENOUS | Status: DC
  Administered 2023-01-26 – 2023-01-27 (×2): 500 mg via INTRAVENOUS
  Filled 2023-01-26 (×2): qty 100

## 2023-01-26 MED ORDER — IOHEXOL 350 MG/ML SOLN
50.0000 mL | Freq: Once | INTRAVENOUS | Status: AC | PRN
  Administered 2023-01-26: 50 mL via INTRAVENOUS

## 2023-01-26 MED ORDER — DIATRIZOATE MEGLUMINE & SODIUM 66-10 % PO SOLN
90.0000 mL | Freq: Once | ORAL | Status: AC
  Administered 2023-01-26: 90 mL via NASOGASTRIC
  Filled 2023-01-26: qty 90

## 2023-01-26 MED ORDER — SODIUM CHLORIDE 0.9 % IV SOLN
2.0000 g | INTRAVENOUS | Status: DC
  Administered 2023-01-26 – 2023-01-27 (×2): 2 g via INTRAVENOUS
  Filled 2023-01-26 (×2): qty 20

## 2023-01-26 NOTE — Consult Note (Addendum)
CC: Abdominal pain  Requesting provider: Dr. Gustavus Messing  HPI: Madison Booker is an 62 y.o. female who is here for persistent nausea vomiting and abdominal pain.  Patient has a history of C-section, hysterectomy, oophorectomy.  Patient stated that she started feeling ill on Thursday.  She developed abdominal pain followed by multiple episodes of emesis.  She states her last bowel movement was 2 days ago.  No flatus since then.  She went to her PCP on March 4 with these complaints.  Imaging was obtained and the patient was subsequently sent to the emergency room.  She denies any fevers or chills.  She denies any trouble urinating.  Patient is a little bit sleepy but granted it is 2 in the morning.  Family member is at the bedside.  Nasogastric tube was placed in the emergency room after CT scan showed a very high-grade partial small bowel obstruction with large fluid-filled stomach.  Over 800 of bilious output is already been obtained from the NG tube   Past Medical History:  Diagnosis Date   H/O abdominal hysterectomy 11/30/1991    Past Surgical History:  Procedure Laterality Date   ABDOMINAL HYSTERECTOMY  1993   CESAREAN SECTION  1992    Family History  Problem Relation Age of Onset   Diabetes Sister        3 sisters   Diabetes gravidarum Sister        3 sisters   Hyperlipidemia Sister        3 sisters   Kidney disease Sister        1 sister   Breast cancer Maternal Aunt     Social:  reports that she has never smoked. She has never used smokeless tobacco. She reports that she does not drink alcohol and does not use drugs.  Allergies:  Allergies  Allergen Reactions   Beef-Derived Products     Medications: I have reviewed the patient's current medications.   ROS - all of the below systems have been reviewed with the patient and positives are indicated with bold text General: chills, fever or night sweats Eyes: blurry vision or double vision ENT: epistaxis or sore  throat Allergy/Immunology: itchy/watery eyes or nasal congestion Hematologic/Lymphatic: bleeding problems, blood clots or swollen lymph nodes Endocrine: temperature intolerance or unexpected weight changes Breast: new or changing breast lumps or nipple discharge Resp: cough, shortness of breath, or wheezing CV: chest pain or dyspnea on exertion GI: as per HPI GU: dysuria, trouble voiding, or hematuria MSK: joint pain or joint stiffness Neuro: TIA or stroke symptoms Derm: pruritus and skin lesion changes Psych: anxiety and depression  PE Blood pressure 130/63, pulse 93, temperature 97.6 F (36.4 C), temperature source Oral, resp. rate (!) 23, height '5\' 6"'$  (1.676 m), weight 65.8 kg, SpO2 95 %. Constitutional: NAD; conversant; no deformities; sleepy but arousable and then becomes more alert Eyes: Moist conjunctiva; no lid lag; anicteric; PERRL Neck: Trachea midline; no thyromegaly Lungs: Normal respiratory effort; no tactile fremitus CV: RRR; no palpable thrills; no pitting edema GI: Abd distended, soft, moderate tenderness to palpation.  No rebound or peritonitis, old Pfannenstiel incisions; no palpable hepatosplenomegaly MSK: ; no clubbing/cyanosis Psychiatric: Appropriate affect; initially very sleepy but granted its 2 in the morning patient became more appropriate and conversive alert and oriented x3 Lymphatic: No palpable cervical or axillary lymphadenopathy Skin: No rash, lesions or jaundice  Results for orders placed or performed during the hospital encounter of 01/25/23 (from the past 48 hour(s))  Lipase,  blood     Status: None   Collection Time: 01/25/23  3:04 PM  Result Value Ref Range   Lipase 28 11 - 51 U/L    Comment: Performed at [facility], [place] [address], [place], [place] [address]  Comprehensive metabolic panel     Status: Abnormal   Collection Time: 01/25/23  3:04 PM  Result Value Ref Range   Sodium 128 (L) 135 - 145 mmol/L   Potassium 4.3 3.5 - 5.1 mmol/L     Comment: HEMOLYSIS AT THIS LEVEL MAY AFFECT RESULT   Chloride 86 (L) 98 - 111 mmol/L   CO2 33 (H) 22 - 32 mmol/L   Glucose, Bld 117 (H) 70 - 99 mg/dL    Comment: Glucose reference range applies only to samples taken after fasting for at least 8 hours.   BUN 33 (H) 6 - 20 mg/dL   Creatinine, Ser 0.71 0.44 - 1.00 mg/dL   Calcium 9.3 8.9 - 10.3 mg/dL   Total Protein 7.4 6.5 - 8.1 g/dL   Albumin 3.8 3.5 - 5.0 g/dL   AST 34 15 - 41 U/L    Comment: HEMOLYSIS AT THIS LEVEL MAY AFFECT RESULT   ALT 26 0 - 44 U/L    Comment: HEMOLYSIS AT THIS LEVEL MAY AFFECT RESULT   Alkaline Phosphatase 85 38 - 126 U/L   Total Bilirubin 1.9 (H) 0.3 - 1.2 mg/dL    Comment: HEMOLYSIS AT THIS LEVEL MAY AFFECT RESULT   GFR, Estimated >60 >60 mL/min    Comment: (NOTE) Calculated using the CKD-EPI Creatinine Equation (2021)    Anion gap 9 5 - 15    Comment: Performed at [facility], [place] [address], [place], [place] [address]  CBC     Status: Abnormal   Collection Time: 01/25/23  3:04 PM  Result Value Ref Range   WBC 10.1 4.0 - 10.5 K/uL   RBC 4.83 3.87 - 5.11 MIL/uL   Hemoglobin 15.5 (H) 12.0 - 15.0 g/dL   HCT 44.7 36.0 - 46.0 %   MCV 92.5 80.0 - 100.0 fL   MCH 32.1 26.0 - 34.0 pg   MCHC 34.7 30.0 - 36.0 g/dL   RDW 11.9 11.5 - 15.5 %   Platelets 263 150 - 400 K/uL   nRBC 0.0 0.0 - 0.2 %    Comment: Performed at [facility], [place] [address], [place], [place] [address]  I-stat chem 8, ED (not at [facility], [facility] or [facility])     Status: Abnormal   Collection Time: 01/25/23  3:25 PM  Result Value Ref Range   Sodium 130 (L) 135 - 145 mmol/L   Potassium 4.4 3.5 - 5.1 mmol/L   Chloride 92 (L) 98 - 111 mmol/L   BUN 43 (H) 6 - 20 mg/dL   Creatinine, Ser 0.70 0.44 - 1.00 mg/dL   Glucose, Bld 118 (H) 70 - 99 mg/dL    Comment: Glucose reference range applies only to samples taken after fasting for at least 8 hours.   Calcium, Ion 0.94 (L) 1.15 - 1.40 mmol/L   TCO2 36 (H) 22 - 32 mmol/L   Hemoglobin 16.3  (H) 12.0 - 15.0 g/dL   HCT 48.0 (H) 36.0 - 46.0 %  Lactic acid, plasma     Status: Abnormal   Collection Time: 01/25/23  3:45 PM  Result Value Ref Range   Lactic Acid, Venous 2.3 (HH) 0.5 - 1.9 mmol/L    Comment: CRITICAL RESULT  CALLED TO, READ BACK BY AND VERIFIED WITH Fayrene Fearing, RN @ [phone] 01/25/23 BY [facility] Performed at [facility], [place] [address] [place], [place] [address]   Lactic acid, plasma     Status: None   Collection Time: 01/25/23  6:50 PM  Result Value Ref Range   Lactic Acid, Venous 1.8 0.5 - 1.9 mmol/L    Comment: Performed at [place] [address], [place], [place] [address]  HIV Antibody (routine testing w rflx)     Status: None   Collection Time: 01/25/23 10:37 PM  Result Value Ref Range   HIV Screen 4th Generation wRfx Non Reactive Non Reactive    Comment: Performed at [facility], [place] [address], [place], [place] [address]    DG Abd Portable 1 View  Result Date: 01/25/2023 CLINICAL DATA:  Nasogastric tube placement EXAM: PORTABLE ABDOMEN - 1 VIEW COMPARISON:  CT abdomen 01/25/2023 FINDINGS: Nasogastric tube coils once in the stomach with tip in the stomach body. Dilated loops of small bowel are again observed up to about 4 cm in diameter. Airspace opacity in the right lower lobe suspicious for pneumonia or aspiration pneumonitis. IMPRESSION: 1. Nasogastric tube coils once in the stomach with tip in the stomach body. 2. Dilated loops of small bowel compatible with small bowel obstruction. 3. Right lower lobe airspace opacity suspicious for pneumonia or aspiration pneumonitis. Electronically Signed   By: Van Clines M.D.   On: 01/25/2023 21:30   CT ABDOMEN PELVIS W CONTRAST  Result Date: 01/25/2023 CLINICAL DATA:  Abdominal pain EXAM: CT ABDOMEN AND PELVIS WITH CONTRAST TECHNIQUE: Multidetector CT imaging of the abdomen and pelvis was performed using the standard protocol following bolus administration of intravenous contrast. RADIATION  DOSE REDUCTION: This exam was performed according to the departmental dose-optimization program which includes automated exposure control, adjustment of the mA and/or kV according to patient size and/or use of iterative reconstruction technique. CONTRAST:  71m OMNIPAQUE IOHEXOL 350 MG/ML SOLN COMPARISON:  Abdominal x-ray same day FINDINGS: Lower chest: Patchy airspace and ground-glass opacities are present in the right lower lobe compatible pneumonia. There is a trace right pleural effusion. Hepatobiliary: Gallstones are present. There is no biliary ductal dilatation. The liver is within normal limits. Pancreas: Unremarkable. No pancreatic ductal dilatation or surrounding inflammatory changes. Spleen: Normal in size without focal abnormality. Adrenals/Urinary Tract: Adrenal glands are unremarkable. Kidneys are normal, without renal calculi, focal lesion, or hydronephrosis. Bladder is unremarkable. Stomach/Bowel: There is high-grade small-bowel obstruction. Transition point is seen in the lower central abdomen coronal image 6/40. Small left proximal to this level is dilated with air-fluid levels measuring up 2 4.3 cm. Additionally the stomach is markedly dilated with air-fluid level. Distal small bowel and colon are decompressed. The appendix is not visualized there is no pneumatosis, free air or focal wall thickening. Vascular/Lymphatic: No significant vascular findings are present. No enlarged abdominal or pelvic lymph nodes. Reproductive: Status post hysterectomy. No adnexal masses. Other: There is a small amount of free fluid in the right upper quadrant and pelvis. Musculoskeletal: No acute or significant osseous findings. IMPRESSION: 1. High-grade small-bowel obstruction with transition point in the lower central abdomen. 2. Small amount of free fluid in the abdomen and pelvis. 3. Right lower lobe pneumonia. Follow-up PA and lateral chest x-ray recommended in 4-6 weeks to confirm resolution. 4. Cholelithiasis.  Electronically Signed   By: ARonney AstersM.D.   On: 01/25/2023 19:07   DG Abd 1 View  Result Date: 01/25/2023 CLINICAL DATA:  Abdominal pain, vomiting. EXAM: ABDOMEN - 1 VIEW COMPARISON:  None Available. FINDINGS: Severely dilated small bowel loops are noted without colonic dilatation, concerning for distal small bowel obstruction. Surgical clips are noted in the pelvis. Cholelithiasis is noted in right upper quadrant. IMPRESSION: Severe small bowel dilatation is noted concerning for distal small bowel obstruction. Cholelithiasis. Electronically Signed   By: Marijo Conception M.D.   On: 01/25/2023 12:30    Imaging: Personally reviewed  A/P: Madison Booker is an 62 y.o. female with   Partial Small bowel obstruction Hyponatremia Cholelithiasis Abnormal CT of Right lung - possible community pneumonia or aspiration PNA given h/o vomiting  While patient does have some abdominal tenderness and distention she does not have fever or leukocytosis.  She does not have hypertension.  There is no bowel wall thickening.  There is no pneumatosis.  There is no portal venous gas.  There is no signs of impending ischemia.  Her lactate has normalized.  Therefore I do not believe she needs urgent laparotomy.  We will do our small bowel obstruction protocol.  Briefly discussed this with the patient and her husband.  Repeat metabolic panel in the morning Does not need antibiotics for her bowel obstruction but defer to medicine whether or not they think patient may need coverage for airspace disease seen on CT imaging.  Patient may have had aspiration given her multiple episodes of emesis  Data reviewed-I reviewed her PCP note from March 1, October 13, 2022, vital signs for the past 12 hours, labs, CT scan, H&P by Dr. Carmina Miller, progress note by Dr. Carmina Miller, ED note from ED attending March 4th  Moderate level of medical decision making,, moderate data review, decision for hospitalization  Leighton Ruff. Redmond Pulling, MD,  FACS General, Bariatric, & Minimally Invasive Surgery [place]

## 2023-01-26 NOTE — Progress Notes (Signed)
Daily Progress Note Intern Pager: [phone]  Patient name: Madison Booker Medical record number: [id number] Date of birth: 09/11/1961 Age: 62 y.o. Gender: female  Primary Care Provider: Kinnie Feil, MD Consultants: General Surgery Code Status: Full  Pt Overview and Major Events to Date:  -01/25/23 - Admitted  Assessment and Plan: Madison Booker is a 62 y.o. female admitted for SBO and additional concern for community acquired pneumonia. Pertinent PMH/PSH includes multiple C-sections, partial hysterectomy, and oophorectomy.   * Small bowel obstruction ([place]) Initially on SBO protocol.  Continues to have stable vitals and reassuring abdominal exam.  NG tube has continued output. However, CTA concerning for intraperitoneal free air.  General surgery aware, will follow recommendations. - Gen surg to see, appreciate their assistance - NG to low intermittent suction - NPO - NS at 172m/hr - IV Tylenol PRN for pain  - CBG PM - Repeat EKG a.m. for Qtc, consider as needed Compazine  Abnormal CT scan of lung CTA chest significant for consolidation versus pleural effusion concerning for possible pneumonia.  Will broaden antibiotics.  Patient remained stable on room air without increased work of breathing. -Continue azithromycin, ceftriaxone, add Flagyl -Follow-up blood cultures -Likely stop antibiotics if blood cultures negative for 48 hours.  Hyperbilirubinemia Bili improved to 1.3 with fluid resuscitation. - Continue to monitor.  - Repeat CMP in am  Hyponatremia Sodium 134, improved status post initiation of IV fluid rehydration. - Repeat CMP in am   FEN/GI: NPO, 125 ml/hr NS PPx: SCDs, Padua risk score of 0  Dispo:Pending clinical improvement  Subjective:  No acute event since admission. Has some mild pain of abdomen. States breathing is doing okay. Reports having one episode of emesis since NG tube was placed.  States she is passing gas.  Objective: Temp:   [97.5 F (36.4 C)-98.2 F (36.8 C)] 97.5 F (36.4 C) (03/05 0756) Pulse Rate:  [78-110] 92 (03/05 0756) Resp:  [16-31] 18 (03/05 0756) BP: (120-141)/(43-76) 120/48 (03/05 0756) SpO2:  [90 %-99 %] 97 % (03/05 0756) Weight:  [65.8 kg] 65.8 kg (03/04 1456) Physical Exam: General: NAD, ill appearing, lying in hospital bed Neuro: A&O Cardiovascular: RRR, no murmurs, no peripheral edema Respiratory: normal WOB on RA, CTAB, no wheezes, ronchi or rales Abdomen: soft, mildly tender to palpation, no rebound or guarding Extremities: Moving all 4 extremities equally LDA: NG tube draining brown/green fluids   Laboratory: Most recent CBC Lab Results  Component Value Date   WBC 12.8 (H) 01/26/2023   HGB 13.7 01/26/2023   HCT 38.9 01/26/2023   MCV 93.3 01/26/2023   PLT 193 01/26/2023   Most recent BMP    Latest Ref Rng & Units 01/26/2023    4:19 AM  BMP  Glucose 70 - 99 mg/dL 106   BUN 6 - 20 mg/dL 24   Creatinine 0.44 - 1.00 mg/dL 0.81   Sodium 135 - 145 mmol/L 134   Potassium 3.5 - 5.1 mmol/L 3.4   Chloride 98 - 111 mmol/L 97   CO2 22 - 32 mmol/L 21   Calcium 8.9 - 10.3 mg/dL 8.3     Other pertinent labs:   Bcx: NG at <12hrs.   Imaging/Diagnostic Tests:  DG Abd 01/26/23 Small-bowel dilatation consistent with obstruction or ileus without definite interval change  CTA Chest 01/26/23 1. No evidence of pulmonary embolism. 2. Dense bibasilar consolidation more severe on the right. 3. Small pleural effusions. 4. Ascites. 5. Free intraperitoneal air.  QSalvadore Oxford MD  01/26/2023, 12:41 PM  PGY-1, [place] Intern pager: [phone], text pages welcome Secure chat group [place]

## 2023-01-26 NOTE — Progress Notes (Addendum)
CALL PAGER [phone] for any questions or notifications regarding this patient  FMTS Attending Note: Dorcas Mcmurray MD Performed CTA to rule out PE and free intraperitoneal air noted. We have notified surgical team of these new findings and are empirically adding flagy antibiotic coverage. She appears comfortable and is hemodynamically stable on my exam.

## 2023-01-26 NOTE — TOC Initial Note (Signed)
Transition of Care [facility]) - Initial/Assessment Note    Patient Details  Name: Madison Booker MRN: [id number] Date of Birth: 1961-05-01  Transition of [facility] CM/SW Contact:    Carles Collet, RN Phone Number: 01/26/2023, 10:26 AM  Clinical Narrative:                  Patient admitted from home w spouse.  SBO protocol. No needs for DC identified at this time. Please consult TOC if needs arise.   Expected Discharge Plan: Home/Self Care Barriers to Discharge: Continued Medical Work up   Patient Goals and CMS Choice            Expected Discharge Plan and Services       Living arrangements for the past 2 months: Single Family Home                                      Prior Living Arrangements/Services Living arrangements for the past 2 months: Standard with:: Spouse                   Activities of Daily Living Home Assistive Devices/Equipment: None ADL Screening (condition at time of admission) Patient's cognitive ability adequate to safely complete daily activities?: Yes Is the patient deaf or have difficulty hearing?: No Does the patient have difficulty seeing, even when wearing glasses/contacts?: No Does the patient have difficulty concentrating, remembering, or making decisions?: No Patient able to express need for assistance with ADLs?: Yes Does the patient have difficulty dressing or bathing?: No Independently performs ADLs?: Yes (appropriate for developmental age) Does the patient have difficulty walking or climbing stairs?: No Weakness of Legs: None Weakness of Arms/Hands: None  Permission Sought/Granted                  Emotional Assessment              Admission diagnosis:  Small bowel obstruction ([place]) [K56.609] Patient Active Problem List   Diagnosis Date Noted   Abdominal pain with vomiting and history of abdominal surgery 01/25/2023   Small bowel obstruction ([place]) 01/25/2023   Abnormal CT scan of lung  01/25/2023   Hyperbilirubinemia 01/25/2023   Hyponatremia 01/25/2023   Breast mass, right 11/04/2022   Toenail fungus 10/19/2022   At high risk for breast cancer 02/13/2022   Hyperlipidemia 11/10/2021   H/O abdominal hysterectomy 11/30/1991   History of unilateral oophorectomy 11/30/1989   PCP:  Kinnie Feil, MD Pharmacy:   [id number] [id number]: [phone]: [phone]    Social Determinants of Health (SDOH) Social History: SDOH Screenings   Food Insecurity: No Food Insecurity (01/25/2023)  Housing: Low Risk  (01/25/2023)  Transportation Needs: No Transportation Needs (01/25/2023)  Utilities: Not At Risk (01/25/2023)  Depression (PHQ2-9): Low Risk  (01/25/2023)  Tobacco Use: Low Risk  (01/25/2023)   SDOH Interventions:     Readmission Risk Interventions     No data to display

## 2023-01-26 NOTE — Progress Notes (Signed)
Called to see patient by primary service due to CTA of the chest ordered by the primary service suggesting a concern for pneumoperitoneum.  Upon arrival, the patient is laying in bed comfortable and states she actually feels better than she did on admission.  Her NGT has about 600cc of thick bilious output in the cannister, but is currently not working.  After manipulation, an immediate 600+cc of thick bilious output obtained.  Cannister changed and Lopez valve placed on NGT for easy access for flushing. The patient's abdomen is distended, with some central tenderness, but no guarding, rebounding, or peritonitis.  She denies feeling bloated.  Vitals all reviewed and are all normal.  She has started the SBO protocol but given the amount of bilious output still decompressing, will likely need to repeat this.  Will get a dedicated plain abdominal x-ray now to follow up on overall abdominal pattern.  Free air noted on CT scan was minimal, with just a few dots.  She does have ascites, which was noted on yesterday's abdominal CT as well.  As of right now, the patient does not emergent surgical intervention as she is very stable.  However, she has free fluid, some small locules of air on her new imaging, and a high-grade obstruction with thick bilious output.  She would be low threshold for surgical intervention if she does not resolve quickly.  We will monitor closely.  Henreitta Cea 10:24 AM 01/26/2023

## 2023-01-27 ENCOUNTER — Inpatient Hospital Stay (HOSPITAL_COMMUNITY): Payer: BC Managed Care – PPO

## 2023-01-27 DIAGNOSIS — K56609 Unspecified intestinal obstruction, unspecified as to partial versus complete obstruction: Secondary | ICD-10-CM | POA: Diagnosis not present

## 2023-01-27 LAB — COMPREHENSIVE METABOLIC PANEL
ALT: 19 U/L (ref 0–44)
AST: 20 U/L (ref 15–41)
Albumin: 2.8 g/dL — ABNORMAL LOW (ref 3.5–5.0)
Alkaline Phosphatase: 64 U/L (ref 38–126)
Anion gap: 10 (ref 5–15)
BUN: 20 mg/dL (ref 6–20)
CO2: 33 mmol/L — ABNORMAL HIGH (ref 22–32)
Calcium: 8.5 mg/dL — ABNORMAL LOW (ref 8.9–10.3)
Chloride: 95 mmol/L — ABNORMAL LOW (ref 98–111)
Creatinine, Ser: 0.7 mg/dL (ref 0.44–1.00)
GFR, Estimated: >60 mL/min (ref >60)
Glucose, Bld: 70 mg/dL (ref 70–99)
Potassium: 3.1 mmol/L — ABNORMAL LOW (ref 3.5–5.1)
Sodium: 138 mmol/L (ref 135–145)
Total Bilirubin: 1.1 mg/dL (ref 0.3–1.2)
Total Protein: 6 g/dL — ABNORMAL LOW (ref 6.5–8.1)

## 2023-01-27 LAB — CBC
HCT: 38.2 % (ref 36.0–46.0)
Hemoglobin: 12.6 g/dL (ref 12.0–15.0)
MCH: 31.7 pg (ref 26.0–34.0)
MCHC: 33 g/dL (ref 30.0–36.0)
MCV: 96.2 fL (ref 80.0–100.0)
Platelets: 192 10*3/uL (ref 150–400)
RBC: 3.97 MIL/uL (ref 3.87–5.11)
RDW: 11.9 % (ref 11.5–15.5)
WBC: 11.3 10*3/uL — ABNORMAL HIGH (ref 4.0–10.5)
nRBC: 0 % (ref 0.0–0.2)

## 2023-01-27 LAB — PHOSPHORUS: Phosphorus: 2.3 mg/dL — ABNORMAL LOW (ref 2.5–4.6)

## 2023-01-27 LAB — GLUCOSE, CAPILLARY: Glucose-Capillary: 89 mg/dL (ref 70–99)

## 2023-01-27 LAB — MAGNESIUM: Magnesium: 2.3 mg/dL (ref 1.7–2.4)

## 2023-01-27 MED ORDER — POTASSIUM PHOSPHATES 15 MMOLE/5ML IV SOLN
15.0000 mmol | Freq: Once | INTRAVENOUS | Status: AC
  Administered 2023-01-27: 15 mmol via INTRAVENOUS
  Filled 2023-01-27: qty 5

## 2023-01-27 MED ORDER — ACETAMINOPHEN 10 MG/ML IV SOLN: 1000.0000 mg | Freq: Four times a day (QID) | INTRAVENOUS | Status: AC | PRN

## 2023-01-27 MED ORDER — MORPHINE SULFATE (PF) 2 MG/ML IV SOLN: 2.0000 mg | INTRAVENOUS | Status: DC | PRN

## 2023-01-27 MED ORDER — DEXTROSE-NACL 5-0.9 % IV SOLN: INTRAVENOUS | Status: AC

## 2023-01-27 MED ORDER — PANTOPRAZOLE SODIUM 40 MG IV SOLR
40.0000 mg | INTRAVENOUS | Status: DC
  Administered 2023-01-27 – 2023-01-31 (×5): 40 mg via INTRAVENOUS
  Filled 2023-01-27 (×5): qty 10

## 2023-01-27 MED ORDER — POTASSIUM CHLORIDE 10 MEQ/100ML IV SOLN
10.0000 meq | INTRAVENOUS | Status: AC
  Administered 2023-01-27 (×4): 10 meq via INTRAVENOUS
  Filled 2023-01-27 (×4): qty 100

## 2023-01-27 MED ORDER — K PHOS MONO-SOD PHOS DI & MONO 155-852-130 MG PO TABS: 250.0000 mg | ORAL_TABLET | Freq: Once | ORAL | Status: DC

## 2023-01-27 NOTE — Progress Notes (Signed)
Subjective: CC: Patient reports central abdominal pain to a 6/10 every time the ngt gets clogged. After flushing ngt her pain improves/nearly resolved. Feels bloated/distended today. Some flatus yesterday, none today. No bm.   She was starting to have pain again when I entered the room. The ngt was clogged. After flushing she immediately had ~150-257m of bilious fluid out from NGT.   VSS without fever, tachycardia or systolic hypotension. WBC improved slightly from 12.8 > 11.3. No AKI. NGT 3.2L/24 hours. No prn pain meds yesterday.   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  PE: Gen:  Alert, NAD, pleasant Pulm:  Rate and effort normal  Abd: Her abdomen is at least mildly distended but very soft. She has generalized ttp greatest in the periumbilical and lower abdomen. There is no rigidity or guarding. Hypoactive bowel sounds. NGT in place with thick bilious output.  Psych: A&Ox3   Lab Results:  Recent Labs    01/26/23 0419 01/27/23 0422  WBC 12.8* 11.3*  HGB 13.7 12.6  HCT 38.9 38.2  PLT 193 192   BMET Recent Labs    01/26/23 0419 01/27/23 0422  NA 134* 138  K 3.4* 3.1*  CL 97* 95*  CO2 21* 33*  GLUCOSE 106* 70  BUN 24* 20  CREATININE 0.81 0.70  CALCIUM 8.3* 8.5*   PT/INR No results for input(s): "LABPROT", "INR" in the last 72 hours. CMP     Component Value Date/Time   NA 138 01/27/2023 0422   NA 138 10/13/2022 0919   K 3.1 (L) 01/27/2023 0422   CL 95 (L) 01/27/2023  0422   CO2 33 (H) 01/27/2023 0422   GLUCOSE 70 01/27/2023 0422   BUN 20 01/27/2023 0422   BUN 11 10/13/2022 0919   CREATININE 0.70 01/27/2023 0422   CALCIUM 8.5 (L) 01/27/2023 0422   PROT 6.0 (L) 01/27/2023 0422   PROT 6.7 10/13/2022 0919   ALBUMIN 2.8 (L) 01/27/2023 0422   ALBUMIN 4.1 10/13/2022 0919   AST 20 01/27/2023 0422   ALT 19 01/27/2023 0422   ALKPHOS 64 01/27/2023 0422   BILITOT 1.1 01/27/2023 0422   BILITOT 1.0 10/13/2022 0919   GFRNONAA >60 01/27/2023 0422   GFRAA 116 10/25/2020 0858   Lipase     Component Value Date/Time   LIPASE 28 01/25/2023 1504    Studies/Results: DG Abd Portable 1V  Result Date: 01/27/2023 CLINICAL DATA:  Follow-up small bowel obstruction. EXAM: PORTABLE ABDOMEN - 1 VIEW COMPARISON:  Similar study yesterday at 1:13 p.m. FINDINGS: 6:09 a.m. Upper to mid abdominal small bowel dilatation up to 4.3 cm is similar to yesterday's exam. Scattered gas and stool remain present in the colon through to the rectum. No nephrolithiasis is seen. There are clustered subcentimeter gallstones in the right upper abdomen. There are surgical clips in the left hemipelvis and faint contrast in the bladder, contrast in the stomach as well. NGT terminates in the gastric body. IMPRESSION: 1. Persistent upper to mid abdominal small bowel dilatation up  to 4.3 cm. Scattered gas and stool remain present in the colon through to the rectum. 2. Cholelithiasis. Electronically Signed   By: Telford Nab M.D.   On: 01/27/2023 06:39   DG Abd Portable 1V-Small Bowel Obstruction Protocol-initial, 8 hr delay  Result Date: 01/26/2023 CLINICAL DATA:  Small-bowel obstruction EXAM: PORTABLE ABDOMEN - 1 VIEW COMPARISON:  01/25/2023 FINDINGS: Enteric tube remains coiled in the gastric fundus. The stomach is distended with contrast. Contrast has not progressed into the small bowel. Persistently dilated loops of small bowel throughout the abdomen compatible with known small bowel obstruction.  Overall, small bowel diameter is similar to prior measuring approximately 4 cm in diameter. No gross free intraperitoneal air on portable supine view. Cholelithiasis. IMPRESSION: Persistently dilated loops of small bowel throughout the abdomen compatible with known small bowel obstruction. Electronically Signed   By: Davina Poke D.O.   On: 01/26/2023 13:31   DG Abd Portable 1V  Result Date: 01/26/2023 CLINICAL DATA:  SBO EXAM: PORTABLE ABDOMEN - 1 VIEW COMPARISON:  01/25/2023 FINDINGS: Dilated loops of small bowel consistent with obstruction or ileus. NG tube superimposed with stomach. Excreted contrast in the urinary bladder and renal collecting systems. IMPRESSION: Small-bowel dilatation consistent with obstruction or ileus without definite interval change. Electronically Signed   By: Sammie Bench M.D.   On: 01/26/2023 10:54   CT Angio Chest Pulmonary Embolism (PE) W or WO Contrast  Addendum Date: 01/26/2023   ADDENDUM REPORT: 01/26/2023 10:20 IMPRESSION: IMPRESSION 6.  Lumbosacral degenerative changes. Electronically Signed   By: Sammie Bench M.D.   On: 01/26/2023 10:20   Result Date: 01/26/2023 CLINICAL DATA:  Chest pain, SOB. EXAM: CT ANGIOGRAPHY CHEST WITH CONTRAST TECHNIQUE: Multidetector CT imaging of the chest was performed using the standard protocol during bolus administration of intravenous contrast. Multiplanar CT image reconstructions and MIPs were obtained to evaluate the vascular anatomy. RADIATION DOSE REDUCTION: This exam was performed according to the departmental dose-optimization program which includes automated exposure control, adjustment of the mA and/or kV according to patient size and/or use of iterative reconstruction technique. CONTRAST:  50 mL OMNIPAQUE IOHEXOL 350 MG/ML SOLN COMPARISON:  None Available. FINDINGS: Cardiovascular: Satisfactory opacification of the pulmonary arteries to the segmental level. No evidence of pulmonary embolism. Normal heart size. No  pericardial effusion. Mediastinum/Nodes: No enlarged mediastinal, hilar, or axillary lymph nodes. Thyroid gland, trachea, and esophagus demonstrate no significant findings. NG tube extends into the stomach. Lungs/Pleura: Dense bibasilar consolidation more severe on the right. No pneumothorax. Minimal pleural effusions. Upper Abdomen: There is evidence of ascites and free intraperitoneal air. Dilated bowel loops. Cirrhotic appearance of the liver. Artifact from dense contrast in the stomach causes degradation of the images. Musculoskeletal: No chest wall abnormality. No acute or significant osseous findings. Review of the MIP images confirms the above findings. IMPRESSION: 1. No evidence of pulmonary embolism. 2. Dense bibasilar consolidation more severe on the right. 3. Small pleural effusions. 4. Ascites. 5. Free intraperitoneal air. 6. Electronically Signed: By: Sammie Bench M.D. On: 01/26/2023 07:55   DG Abd Portable 1 View  Result Date: 01/25/2023 CLINICAL DATA:  Nasogastric tube placement EXAM: PORTABLE ABDOMEN - 1 VIEW COMPARISON:  CT abdomen 01/25/2023 FINDINGS: Nasogastric tube coils once in the stomach with tip in the stomach body. Dilated loops of small bowel are again observed up to about 4 cm in diameter. Airspace opacity in the right lower lobe suspicious for pneumonia or aspiration pneumonitis. IMPRESSION: 1. Nasogastric tube coils once in the stomach with tip  in the stomach body. 2. Dilated loops of small bowel compatible with small bowel obstruction. 3. Right lower lobe airspace opacity suspicious for pneumonia or aspiration pneumonitis. Electronically Signed   By: Van Clines M.D.   On: 01/25/2023 21:30   CT ABDOMEN PELVIS W CONTRAST  Result Date: 01/25/2023 CLINICAL DATA:  Abdominal pain EXAM: CT ABDOMEN AND PELVIS WITH CONTRAST TECHNIQUE: Multidetector CT imaging of the abdomen and pelvis was performed using the standard protocol following bolus administration of intravenous  contrast. RADIATION DOSE REDUCTION: This exam was performed according to the departmental dose-optimization program which includes automated exposure control, adjustment of the mA and/or kV according to patient size and/or use of iterative reconstruction technique. CONTRAST:  60m OMNIPAQUE IOHEXOL 350 MG/ML SOLN COMPARISON:  Abdominal x-ray same day FINDINGS: Lower chest: Patchy airspace and ground-glass opacities are present in the right lower lobe compatible pneumonia. There is a trace right pleural effusion. Hepatobiliary: Gallstones are present. There is no biliary ductal dilatation. The liver is within normal limits. Pancreas: Unremarkable. No pancreatic ductal dilatation or surrounding inflammatory changes. Spleen: Normal in size without focal abnormality. Adrenals/Urinary Tract: Adrenal glands are unremarkable. Kidneys are normal, without renal calculi, focal lesion, or hydronephrosis. Bladder is unremarkable. Stomach/Bowel: There is high-grade small-bowel obstruction. Transition point is seen in the lower central abdomen coronal image 6/40. Small left proximal to this level is dilated with air-fluid levels measuring up 2 4.3 cm. Additionally the stomach is markedly dilated with air-fluid level. Distal small bowel and colon are decompressed. The appendix is not visualized there is no pneumatosis, free air or focal wall thickening. Vascular/Lymphatic: No significant vascular findings are present. No enlarged abdominal or pelvic lymph nodes. Reproductive: Status post hysterectomy. No adnexal masses. Other: There is a small amount of free fluid in the right upper quadrant and pelvis. Musculoskeletal: No acute or significant osseous findings. IMPRESSION: 1. High-grade small-bowel obstruction with transition point in the lower central abdomen. 2. Small amount of free fluid in the abdomen and pelvis. 3. Right lower lobe pneumonia. Follow-up PA and lateral chest x-ray recommended in 4-6 weeks to confirm resolution.  4. Cholelithiasis. Electronically Signed   By: ARonney AstersM.D.   On: 01/25/2023 19:07   DG Abd 1 View  Result Date: 01/25/2023 CLINICAL DATA:  Abdominal pain, vomiting. EXAM: ABDOMEN - 1 VIEW COMPARISON:  None Available. FINDINGS: Severely dilated small bowel loops are noted without colonic dilatation, concerning for distal small bowel obstruction. Surgical clips are noted in the pelvis. Cholelithiasis is noted in right upper quadrant. IMPRESSION: Severe small bowel dilatation is noted concerning for distal small bowel obstruction. Cholelithiasis. Electronically Signed   By: JMarijo ConceptionM.D.   On: 01/25/2023 12:30    Anti-infectives: Anti-infectives (From admission, onward)    Start     Dose/Rate Route Frequency Ordered Stop   01/26/23 2200  azithromycin (ZITHROMAX) 250 mg in dextrose 5 % 125 mL IVPB        250 mg 127.5 mL/hr over 60 Minutes Intravenous Every 24 hours 01/25/23 2146     01/26/23 2200  cefTRIAXone (ROCEPHIN) 2 g in sodium chloride 0.9 % 100 mL IVPB        2 g 200 mL/hr over 30 Minutes Intravenous Every 24 hours 01/26/23 1129     01/26/23 1000  metroNIDAZOLE (FLAGYL) IVPB 500 mg        500 mg 100 mL/hr over 60 Minutes Intravenous Every 12 hours 01/26/23 0917     01/25/23 2145  cefTRIAXone (ROCEPHIN)  1 g in sodium chloride 0.9 % 100 mL IVPB  Status:  Discontinued        1 g 200 mL/hr over 30 Minutes Intravenous Every 24 hours 01/25/23 2146 01/26/23 1129   01/25/23 2145  azithromycin (ZITHROMAX) 500 mg in sodium chloride 0.9 % 250 mL IVPB        500 mg 250 mL/hr over 60 Minutes Intravenous  Once 01/25/23 2146 01/26/23 0046        Assessment/Plan SBO - History of C-section, hysterectomy, oophorectomy.  - CT on admission w/ High-grade small-bowel obstruction with transition point in the lower central abdomen with small amount of free fluid  - CTA chest ordered by the primary service yesterday suggesting a concern for pneumoperitoneum. Evaluated by our team and  actually looked improved. Recommended continued conservative management.  - Xray this am with persistent small bowel dilation. There is some gas in the colon. Contrast remains in stomach. NGT looks in good position on this mornings film and is functional after flushing (3.2L/24 hours). Will write for q4hr flushes. Messaged RN.  - VSS without fever, tachycardia or systolic hypotension. WBC improved slightly from 12.8 > 11.3. No AKI. Abdominal exam without peritonitis. Will review case with MD. While I do not think she needs emergency surgery today/at this moment, I suspect she will need an operation during admission unless she dramatically improves today/overnight. I consented her for exploratory laparotomy with possible SBR. The procedure and material risks were discussed with the patient. Risks include but are not limited to anesthesia (MI, CVA, prolonged intubation, aspiration, death), pain, bleeding,  infection, scarring, hernia, damage to surrounding structures (blood vessels/nerves/viscus/organs/ureter), ileus, leak from anastomosis and DVT/PE. We also discussed typical post-operative care. The patient's questions were answered to their satisfaction, they voiced understanding and were agreeable.  - Cont NPO, NGT to LIWS today w/ repeat labs and film in am  FEN - NPO, NGT, IVF per primary. Replace K (3.1) and phos (2.3) VTE - SCDs, okay for chem ppx from a general surgery standpoint ID - Azithromycin, Rocephin, Flagyl per primary. Does not need abx from a general surgery standpoint  I reviewed nursing notes, hospitalist notes, last 24 h vitals and pain scores, last 48 h intake and output, last 24 h labs and trends, and last 24 h imaging results.   LOS: 1 day    Jillyn Ledger , [facility] 01/27/2023, 8:57 AM Please see Amion for pager number during day hours 7:00am-4:30pm

## 2023-01-27 NOTE — Progress Notes (Signed)
FMTS Brief Progress Note  S: Patient seen with Dr. Joelyn Oms. Patient seen lying upright in bed with NG tube in place, in no acute distress. Feels better after being repositioned by the nurse, reports decreased nausea. Denies other acute concerns. Reports fantasizing about food. Bilious output noted in canister draining.   O: BP (!) 130/47 (BP Location: Right Arm)   Pulse 81   Temp 97.8 F (36.6 C) (Oral)   Resp 16   Ht '5\' 6"'$  (1.676 m)   Wt 145 lb (65.8 kg)   SpO2 97%   BMI 23.40 kg/m   GEN: in no acute distress, NG tube in place.  ABD: soft, non-tender, non-distended  NEURO: alert, answering questions appropriately.   A/P: SBO CTA with concern for intraperitoneal free air. Gen surg recommending continuing conservative management for now. Reports decreased nausea with being re-positioned.  -AM EKG  -continuing NG tube, NS, NPO  -rest of plan per day team  Abnormal CT scan lung  CTA chest concerning for possible PNA. Prelim Bcx NGTD.  -continued on azithromycin, ceftriaxone and flagyl -rest of plan per day team  - Orders reviewed. Labs for AM ordered, which was adjusted as needed.  - If condition changes, plan includes consult gen surg.   Rolanda Lundborg, MD 01/27/2023, 2:17 AM PGY-1, [facility] Night Resident  Please page [phone] with questions.

## 2023-01-27 NOTE — Progress Notes (Signed)
     Daily Progress Note Intern Pager: [phone]  Patient name: Madison Booker Medical record number: [id number] Date of birth: 08-30-1961 Age: 62 y.o. Gender: female  Primary Care Provider: Kinnie Feil, MD Consultants: General Surgery Code Status: Full  Pt Overview and Major Events to Date:  -01/25/23 - Admitted   Assessment and Plan: Madison Booker is a 62 y.o. female admitted for SBO and additional concern for community acquired pneumonia. Pertinent PMH/PSH includes multiple C-sections, partial hysterectomy, and oophorectomy.   * Small bowel obstruction (HCC) On SBO protocol. CBGs ~80s. Continues to have stable vitals and reassuring abdominal exam.  NG tube has continued output. Continue co-management with General Surgery. - Gen surg consulted, recs appreciated - NG to low intermittent suction - NPO - Transition to D5NS at 196m/hr - IV Tylenol PRN for pain  - Repeat EKG a.m. for Qtc, consider as needed Compazine - AM BMP - Replete electrolytes as needed  Abnormal CT scan of lung CTA chest significant for consolidation versus pleural effusion concerning for possible pneumonia.  Will broaden antibiotics.  Patient remained stable on room air without increased work of breathing. -Continue azithromycin, ceftriaxone, add Flagyl -blood cultures NGTD <12hrs -Likely stop antibiotics if blood cultures negative for 48 hours.  Hyperbilirubinemia-resolved as of 01/27/2023 Bili improved to 1.1. - Continue to monitor.  - Repeat CMP in am  Hyponatremia-resolved as of 01/27/2023 Sodium 138, improved status post initiation of IV fluid rehydration. - Repeat CMP in am   FEN/GI: NPO, 125 ml/hr D5NS PPx: SCDs, Padua risk score of 0  Dispo:Pending clinical improvement  Subjective:  NAEO. Understands plan to keep decompressing GI tract. State she has not passed gas today.  Objective: Temp:  [97.5 F (36.4 C)-98.3 F (36.8 C)] 97.5 F (36.4 C) (03/06 0743) Pulse Rate:   [73-81] 78 (03/06 0743) Resp:  [16-18] 17 (03/06 0743) BP: (110-130)/(46-60) 130/53 (03/06 0743) SpO2:  [96 %-98 %] 98 % (03/06 0743) Physical Exam: General: NAD Neuro: A&O Cardiovascular: RRR, no murmurs, no peripheral edema Respiratory: normal WOB on RA, CTAB, no wheezes, ronchi or rales Abdomen: soft, mildly tender to palpaiton, no rebound or guarding Extremities: Moving all 4 extremities equally   Laboratory: Most recent CBC Lab Results  Component Value Date   WBC 11.3 (H) 01/27/2023   HGB 12.6 01/27/2023   HCT 38.2 01/27/2023   MCV 96.2 01/27/2023   PLT 192 01/27/2023   Most recent BMP    Latest Ref Rng & Units 01/27/2023    4:22 AM  BMP  Glucose 70 - 99 mg/dL 70   BUN 6 - 20 mg/dL 20   Creatinine 0.44 - 1.00 mg/dL 0.70   Sodium 135 - 145 mmol/L 138   Potassium 3.5 - 5.1 mmol/L 3.1   Chloride 98 - 111 mmol/L 95   CO2 22 - 32 mmol/L 33   Calcium 8.9 - 10.3 mg/dL 8.5     Other pertinent labs: Glu 89, Mag 2.3, Phos 2.3  Imaging/Diagnostic Tests:  KUB:  My Read: distended bowels, no significant interval change   QSalvadore Oxford MD 01/27/2023, 12:43 PM  PGY-1, CLincoln BeachIntern pager: [phone] text pages welcome Secure chat group [facility] HospitalTeaching Service

## 2023-01-28 ENCOUNTER — Inpatient Hospital Stay (HOSPITAL_COMMUNITY): Payer: BC Managed Care – PPO

## 2023-01-28 DIAGNOSIS — K56609 Unspecified intestinal obstruction, unspecified as to partial versus complete obstruction: Secondary | ICD-10-CM | POA: Diagnosis not present

## 2023-01-28 DIAGNOSIS — E876 Hypokalemia: Secondary | ICD-10-CM

## 2023-01-28 LAB — CBC
HCT: 35.3 % — ABNORMAL LOW (ref 36.0–46.0)
Hemoglobin: 12.2 g/dL (ref 12.0–15.0)
MCH: 32.1 pg (ref 26.0–34.0)
MCHC: 34.6 g/dL (ref 30.0–36.0)
MCV: 92.9 fL (ref 80.0–100.0)
Platelets: 182 10*3/uL (ref 150–400)
RBC: 3.8 MIL/uL — ABNORMAL LOW (ref 3.87–5.11)
RDW: 11.8 % (ref 11.5–15.5)
WBC: 8.6 10*3/uL (ref 4.0–10.5)
nRBC: 0 % (ref 0.0–0.2)

## 2023-01-28 LAB — BASIC METABOLIC PANEL
Anion gap: 10 (ref 5–15)
BUN: 13 mg/dL (ref 6–20)
CO2: 30 mmol/L (ref 22–32)
Calcium: 8.1 mg/dL — ABNORMAL LOW (ref 8.9–10.3)
Chloride: 98 mmol/L (ref 98–111)
Creatinine, Ser: 0.51 mg/dL (ref 0.44–1.00)
GFR, Estimated: >60 mL/min (ref >60)
Glucose, Bld: 128 mg/dL — ABNORMAL HIGH (ref 70–99)
Potassium: 3 mmol/L — ABNORMAL LOW (ref 3.5–5.1)
Sodium: 138 mmol/L (ref 135–145)

## 2023-01-28 LAB — GLUCOSE, CAPILLARY
Glucose-Capillary: 96 mg/dL (ref 70–99)
Glucose-Capillary: 81 mg/dL (ref 70–99)

## 2023-01-28 LAB — MAGNESIUM: Magnesium: 2.3 mg/dL (ref 1.7–2.4)

## 2023-01-28 LAB — PHOSPHORUS: Phosphorus: 1.7 mg/dL — ABNORMAL LOW (ref 2.5–4.6)

## 2023-01-28 MED ORDER — POTASSIUM CHLORIDE 10 MEQ/100ML IV SOLN
10.0000 meq | Freq: Once | INTRAVENOUS | Status: AC
  Administered 2023-01-28: 10 meq via INTRAVENOUS
  Filled 2023-01-28: qty 100

## 2023-01-28 MED ORDER — POTASSIUM PHOSPHATES 15 MMOLE/5ML IV SOLN
15.0000 mmol | Freq: Once | INTRAVENOUS | Status: AC
  Administered 2023-01-28: 15 mmol via INTRAVENOUS
  Filled 2023-01-28: qty 5

## 2023-01-28 MED ORDER — POTASSIUM CHLORIDE 10 MEQ/100ML IV SOLN
10.0000 meq | INTRAVENOUS | Status: AC
  Administered 2023-01-28 (×3): 10 meq via INTRAVENOUS
  Filled 2023-01-28 (×3): qty 100

## 2023-01-28 MED ORDER — POTASSIUM CHLORIDE 10 MEQ/100ML IV SOLN: 10.0000 meq | INTRAVENOUS | Status: DC

## 2023-01-28 NOTE — Progress Notes (Signed)
     Daily Progress Note Intern Pager: [phone]  Patient name: Madison Booker Medical record number: [id number] Date of birth: [birth year] Age: 62 y.o. Gender: female  Primary Care Provider: Kinnie Feil, MD Consultants: General Surgery Code Status: Full  Pt Overview and Major Events to Date:  -01/25/23 - Admitted   Assessment and Plan: Madison Booker is a 62 y.o. female admitted for SBO and additional concern for community acquired pneumonia. Pertinent PMH/PSH includes multiple C-sections, partial hysterectomy, and oophorectomy.   * Small bowel obstruction (HCC) On SBO protocol. CBGs improved. Continues to have stable vitals and reassuring abdominal exam. Qtc 450. NG tube has continued output. Continue co-management with General Surgery. Likely requiring surgery, but will continue to watch closely. - Gen surg consulted, recs appreciated - NG to low intermittent suction - NPO - D5NS at 156m/hr - IV Tylenol PRN for pain  - AM BMP - Replete electrolytes as needed  Abnormal CT scan of lung CTA chest significant for consolidation versus pleural effusion concerning for possible pneumonia.  As blood cultures negative for 48 hours without respiratory symptoms or admission will stop antibiotics today. Patient remained stable on room air without increased work of breathing. -Stop azithromycin, ceftriaxone -blood cultures NGTD 48hrs -Follow-up outpatient CXR    FEN/GI: NPO, 125 ml/hr D5NS PPx: SCDs, Padua risk score of 0  Dispo:Pending clinical improvement  Subjective:  NAEO. Is passing gas today. Does have some crampy abdominal pain that feels like she needs to have a bowel movement.   Objective: Temp:  [97.8 F (36.6 C)-98.3 F (36.8 C)] 98 F (36.7 C) (03/07 0800) Pulse Rate:  [62-71] 62 (03/07 0800) Resp:  [16-20] 18 (03/07 0800) BP: (112-130)/(47-58) 118/47 (03/07 0800) SpO2:  [92 %-99 %] 92 % (03/07 0800) Physical Exam: General: NAD  Neuro:  A&O Cardiovascular: RRR, no murmurs, no peripheral edema Respiratory: normal WOB on RA, CTAB, no wheezes, ronchi or rales Abdomen: soft, epigastric and left lower quadrant tenderness, no rebound or guarding Extremities: Moving all 4 extremities equally   Laboratory: Most recent CBC Lab Results  Component Value Date   WBC 8.6 01/28/2023   HGB 12.2 01/28/2023   HCT 35.3 (L) 01/28/2023   MCV 92.9 01/28/2023   PLT 182 01/28/2023   Most recent BMP    Latest Ref Rng & Units 01/28/2023    4:24 AM  BMP  Glucose 70 - 99 mg/dL 128   BUN 6 - 20 mg/dL 13   Creatinine 0.44 - 1.00 mg/dL 0.51   Sodium 135 - 145 mmol/L 138   Potassium 3.5 - 5.1 mmol/L 3.0   Chloride 98 - 111 mmol/L 98   CO2 22 - 32 mmol/L 30   Calcium 8.9 - 10.3 mg/dL 8.1     Other pertinent labs: Phos 1.7, Mag 2.3  Imaging/Diagnostic Tests:  KUB 01/28/23 Stable small bowel dilatation is noted concerning for distal small bowel obstruction.  QSalvadore Oxford MD 01/28/2023, 11:17 AM  PGY-1, [place]Intern pager: [phone] text pages welcome Secure chat group [email or web address]

## 2023-01-28 NOTE — Progress Notes (Signed)
Subjective: CC: Patient reports no flatus yesterday. This am she started passing flatus every hour. No bm. She is now having 2/10 lower abdominal cramping that is new, waxing/waning. Nausea resolved but she still feels a little distended.   VSS without fever, tachycardia or systolic hypotension. WBC has normalized to 8.6. No AKI. K 3.0, Phos 1.7. Suspect 2/2 high output from NGT. NGT 2.75L/24 hours (1/2 cup ice yesterday). No prn pain meds yesterday. Xray still pending this am.   Objective: Vital signs in last 24 hours: Temp:  [97.5 F (36.4 C)-98.3 F (36.8 C)] 98.1 F (36.7 C) (03/07 0511) Pulse Rate:  [62-78] 62 (03/07 0511) Resp:  [16-20] 16 (03/07 0511) BP: (112-130)/(48-58) 112/53 (03/07 0511) SpO2:  [96 %-99 %] 99 % (03/07 0511) Last BM Date : 01/24/23  Intake/Output from previous day: 03/06 0701 - 03/07 0700 In: 460 [NG/GT:460] Out: 2750 [Emesis/NG output:2750] Intake/Output this shift: No intake/output data recorded.  PE: Gen:  Alert, NAD, pleasant Pulm:  Rate and effort normal  Abd: Mild to moderate distension but very soft. She has generalized ttp greatest in the periumbilical and suprapubic abdomen. There is no rigidity or guarding. Hypoactive bowel sounds. NGT in place with thick bilious output.  Psych: A&Ox3   Lab Results:  Recent Labs    01/27/23 0422 01/28/23 0424  WBC 11.3* 8.6  HGB 12.6 12.2  HCT 38.2 35.3*  PLT 192 182    BMET Recent Labs    01/27/23 0422 01/28/23 0424  NA 138 138  K 3.1* 3.0*  CL 95* 98  CO2 33* 30  GLUCOSE 70 128*  BUN 20 13  CREATININE 0.70 0.51  CALCIUM 8.5* 8.1*    PT/INR No results for input(s): "LABPROT", "INR" in the last 72 hours. CMP     Component Value Date/Time   NA 138 01/28/2023 0424   NA 138 10/13/2022 0919   K 3.0 (L) 01/28/2023 0424   CL 98 01/28/2023 0424   CO2 30 01/28/2023 0424   GLUCOSE 128 (H) 01/28/2023 0424   BUN 13 01/28/2023 0424   BUN 11 10/13/2022 0919   CREATININE 0.51  01/28/2023 0424   CALCIUM 8.1 (L) 01/28/2023 0424   PROT 6.0 (L) 01/27/2023 0422   PROT 6.7 10/13/2022 0919   ALBUMIN 2.8 (L) 01/27/2023 0422   ALBUMIN 4.1 10/13/2022 0919   AST 20 01/27/2023 0422   ALT 19 01/27/2023 0422   ALKPHOS 64 01/27/2023 0422   BILITOT 1.1 01/27/2023 0422   BILITOT 1.0 10/13/2022 0919   GFRNONAA >60 01/28/2023 0424   GFRAA 116 10/25/2020 0858   Lipase     Component Value Date/Time   LIPASE 28 01/25/2023 1504    Studies/Results: DG Abd Portable 1V  Result Date: 01/27/2023 CLINICAL DATA:  Follow-up small bowel obstruction. EXAM: PORTABLE ABDOMEN - 1 VIEW COMPARISON:  Similar study yesterday at 1:13 p.m. FINDINGS: 6:09 a.m. Upper to mid abdominal small bowel dilatation up to 4.3 cm is similar to yesterday's exam. Scattered gas and stool remain present in the colon through to the rectum. No nephrolithiasis is seen. There are clustered subcentimeter gallstones in the right upper abdomen. There are surgical clips in the left hemipelvis and faint contrast in the bladder, contrast in the stomach as well. NGT terminates in the gastric body. IMPRESSION: 1. Persistent upper to mid abdominal small bowel dilatation up to 4.3 cm. Scattered gas and stool remain present in the colon through to the rectum. 2. Cholelithiasis. Electronically Signed  By: Telford Nab M.D.   On: 01/27/2023 06:39   DG Abd Portable 1V-Small Bowel Obstruction Protocol-initial, 8 hr delay  Result Date: 01/26/2023 CLINICAL DATA:  Small-bowel obstruction EXAM: PORTABLE ABDOMEN - 1 VIEW COMPARISON:  01/25/2023 FINDINGS: Enteric tube remains coiled in the gastric fundus. The stomach is distended with contrast. Contrast has not progressed into the small bowel. Persistently dilated loops of small bowel throughout the abdomen compatible with known small bowel obstruction. Overall, small bowel diameter is similar to prior measuring approximately 4 cm in diameter. No gross free intraperitoneal air on portable  supine view. Cholelithiasis. IMPRESSION: Persistently dilated loops of small bowel throughout the abdomen compatible with known small bowel obstruction. Electronically Signed   By: Davina Poke D.O.   On: 01/26/2023 13:31   DG Abd Portable 1V  Result Date: 01/26/2023 CLINICAL DATA:  SBO EXAM: PORTABLE ABDOMEN - 1 VIEW COMPARISON:  01/25/2023 FINDINGS: Dilated loops of small bowel consistent with obstruction or ileus. NG tube superimposed with stomach. Excreted contrast in the urinary bladder and renal collecting systems. IMPRESSION: Small-bowel dilatation consistent with obstruction or ileus without definite interval change. Electronically Signed   By: Sammie Bench M.D.   On: 01/26/2023 10:54   CT Angio Chest Pulmonary Embolism (PE) W or WO Contrast  Addendum Date: 01/26/2023   ADDENDUM REPORT: 01/26/2023 10:20 IMPRESSION: IMPRESSION 6.  Lumbosacral degenerative changes. Electronically Signed   By: Sammie Bench M.D.   On: 01/26/2023 10:20   Result Date: 01/26/2023 CLINICAL DATA:  Chest pain, SOB. EXAM: CT ANGIOGRAPHY CHEST WITH CONTRAST TECHNIQUE: Multidetector CT imaging of the chest was performed using the standard protocol during bolus administration of intravenous contrast. Multiplanar CT image reconstructions and MIPs were obtained to evaluate the vascular anatomy. RADIATION DOSE REDUCTION: This exam was performed according to the departmental dose-optimization program which includes automated exposure control, adjustment of the mA and/or kV according to patient size and/or use of iterative reconstruction technique. CONTRAST:  50 mL OMNIPAQUE IOHEXOL 350 MG/ML SOLN COMPARISON:  None Available. FINDINGS: Cardiovascular: Satisfactory opacification of the pulmonary arteries to the segmental level. No evidence of pulmonary embolism. Normal heart size. No pericardial effusion. Mediastinum/Nodes: No enlarged mediastinal, hilar, or axillary lymph nodes. Thyroid gland, trachea, and esophagus  demonstrate no significant findings. NG tube extends into the stomach. Lungs/Pleura: Dense bibasilar consolidation more severe on the right. No pneumothorax. Minimal pleural effusions. Upper Abdomen: There is evidence of ascites and free intraperitoneal air. Dilated bowel loops. Cirrhotic appearance of the liver. Artifact from dense contrast in the stomach causes degradation of the images. Musculoskeletal: No chest wall abnormality. No acute or significant osseous findings. Review of the MIP images confirms the above findings. IMPRESSION: 1. No evidence of pulmonary embolism. 2. Dense bibasilar consolidation more severe on the right. 3. Small pleural effusions. 4. Ascites. 5. Free intraperitoneal air. 6. Electronically Signed: By: Sammie Bench M.D. On: 01/26/2023 07:55    Anti-infectives: Anti-infectives (From admission, onward)    Start     Dose/Rate Route Frequency Ordered Stop   01/26/23 2200  azithromycin (ZITHROMAX) 250 mg in dextrose 5 % 125 mL IVPB        250 mg 127.5 mL/hr over 60 Minutes Intravenous Every 24 hours 01/25/23 2146     01/26/23 2200  cefTRIAXone (ROCEPHIN) 2 g in sodium chloride 0.9 % 100 mL IVPB        2 g 200 mL/hr over 30 Minutes Intravenous Every 24 hours 01/26/23 1129     01/26/23 1000  metroNIDAZOLE (FLAGYL) IVPB 500 mg  Status:  Discontinued        500 mg 100 mL/hr over 60 Minutes Intravenous Every 12 hours 01/26/23 0917 01/27/23 1114   01/25/23 2145  cefTRIAXone (ROCEPHIN) 1 g in sodium chloride 0.9 % 100 mL IVPB  Status:  Discontinued        1 g 200 mL/hr over 30 Minutes Intravenous Every 24 hours 01/25/23 2146 01/26/23 1129   01/25/23 2145  azithromycin (ZITHROMAX) 500 mg in sodium chloride 0.9 % 250 mL IVPB        500 mg 250 mL/hr over 60 Minutes Intravenous  Once 01/25/23 2146 01/26/23 0046        Assessment/Plan SBO - History of C-section, hysterectomy, oophorectomy.  - CT on admission w/ High-grade small-bowel obstruction with transition point in  the lower central abdomen with small amount of free fluid  - CTA chest ordered by the primary service 3/5 suggesting a concern for pneumoperitoneum. Evaluated by our team and actually looked improved. Recommended continued conservative management.  - VSS without fever, tachycardia or systolic hypotension. Abdominal exam without peritonitis. WBC wnl.  - Patient with some signs that she is improved (WBC normalized, now passing flatus - although she was doing this on 3/5 as well) but she still has high output from ngt and distension/ttp on exam. Will see what xray shows this am. I think she still may have a partial obstruction and could benefit from surgery. Will review with MD to see if we proceed with OR today vs continue to watch another day with her showing some signs of improvement.  - If patient requires OR, please see note from 3/6 outlining the procedure and material risks that were discussed with the patient.  - Cont NPO, NGT to LIWS   FEN - NPO, NGT, IVF per primary. Replace K (3.0) and phos (1.7) VTE - SCDs, okay for chem ppx from a general surgery standpoint ID - Azithromycin and Rocephin per primary.   I reviewed nursing notes, hospitalist notes, last 24 h vitals and pain scores, last 48 h intake and output, last 24 h labs and trends, and last 24 h imaging results.   LOS: 2 days    Jillyn Ledger , [facility] 01/28/2023, 7:40 AM Please see Amion for pager number during day hours 7:00am-4:30pm

## 2023-01-28 NOTE — Progress Notes (Signed)
Pt. Pull NG tube out a good amount while standing up to transfer to [facility], This nurse cut off suction and advance tube back. Md notified. New X -Ray orders

## 2023-01-29 ENCOUNTER — Inpatient Hospital Stay (HOSPITAL_COMMUNITY): Payer: BC Managed Care – PPO

## 2023-01-29 ENCOUNTER — Inpatient Hospital Stay: Payer: Self-pay

## 2023-01-29 DIAGNOSIS — K56609 Unspecified intestinal obstruction, unspecified as to partial versus complete obstruction: Secondary | ICD-10-CM | POA: Diagnosis not present

## 2023-01-29 DIAGNOSIS — E876 Hypokalemia: Secondary | ICD-10-CM | POA: Diagnosis not present

## 2023-01-29 LAB — BASIC METABOLIC PANEL
Anion gap: 6 (ref 5–15)
BUN: 6 mg/dL (ref 6–20)
CO2: 24 mmol/L (ref 22–32)
Calcium: 7.6 mg/dL — ABNORMAL LOW (ref 8.9–10.3)
Chloride: 107 mmol/L (ref 98–111)
Creatinine, Ser: 0.43 mg/dL — ABNORMAL LOW (ref 0.44–1.00)
GFR, Estimated: >60 mL/min (ref >60)
Glucose, Bld: 114 mg/dL — ABNORMAL HIGH (ref 70–99)
Potassium: 3.2 mmol/L — ABNORMAL LOW (ref 3.5–5.1)
Sodium: 137 mmol/L (ref 135–145)

## 2023-01-29 LAB — GLUCOSE, CAPILLARY
Glucose-Capillary: 112 mg/dL — ABNORMAL HIGH (ref 70–99)
Glucose-Capillary: 79 mg/dL (ref 70–99)

## 2023-01-29 LAB — CBC
HCT: 32.2 % — ABNORMAL LOW (ref 36.0–46.0)
Hemoglobin: 11.1 g/dL — ABNORMAL LOW (ref 12.0–15.0)
MCH: 32.3 pg (ref 26.0–34.0)
MCHC: 34.5 g/dL (ref 30.0–36.0)
MCV: 93.6 fL (ref 80.0–100.0)
Platelets: 178 10*3/uL (ref 150–400)
RBC: 3.44 MIL/uL — ABNORMAL LOW (ref 3.87–5.11)
RDW: 11.7 % (ref 11.5–15.5)
WBC: 7 10*3/uL (ref 4.0–10.5)
nRBC: 0 % (ref 0.0–0.2)

## 2023-01-29 LAB — MAGNESIUM: Magnesium: 2 mg/dL (ref 1.7–2.4)

## 2023-01-29 LAB — PHOSPHORUS: Phosphorus: 1.6 mg/dL — ABNORMAL LOW (ref 2.5–4.6)

## 2023-01-29 MED ORDER — CHLORHEXIDINE GLUCONATE CLOTH 2 % EX PADS
6.0000 | MEDICATED_PAD | Freq: Every day | CUTANEOUS | Status: DC
  Administered 2023-01-30 – 2023-02-01 (×3): 6 via TOPICAL

## 2023-01-29 MED ORDER — POTASSIUM PHOSPHATES 15 MMOLE/5ML IV SOLN: 15.0000 mmol | Freq: Once | INTRAVENOUS | Status: DC

## 2023-01-29 MED ORDER — BISACODYL 10 MG RE SUPP
10.0000 mg | Freq: Once | RECTAL | Status: AC
  Administered 2023-01-29: 10 mg via RECTAL
  Filled 2023-01-29: qty 1

## 2023-01-29 MED ORDER — POTASSIUM PHOSPHATES 15 MMOLE/5ML IV SOLN
15.0000 mmol | Freq: Once | INTRAVENOUS | Status: AC
  Administered 2023-01-29: 15 mmol via INTRAVENOUS
  Filled 2023-01-29 (×2): qty 5

## 2023-01-29 MED ORDER — POTASSIUM PHOSPHATES 15 MMOLE/5ML IV SOLN
15.0000 mmol | Freq: Once | INTRAVENOUS | Status: AC
  Administered 2023-01-29: 15 mmol via INTRAVENOUS
  Filled 2023-01-29: qty 5

## 2023-01-29 MED ORDER — SODIUM CHLORIDE 0.9% FLUSH: 10.0000 mL | INTRAVENOUS | Status: DC | PRN

## 2023-01-29 MED ORDER — INSULIN ASPART 100 UNIT/ML IJ SOLN: 0.0000 [IU] | INTRAMUSCULAR | Status: DC

## 2023-01-29 MED ORDER — POTASSIUM CHLORIDE 10 MEQ/100ML IV SOLN
10.0000 meq | INTRAVENOUS | Status: AC
  Administered 2023-01-29 (×4): 10 meq via INTRAVENOUS
  Filled 2023-01-29 (×4): qty 100

## 2023-01-29 NOTE — Progress Notes (Addendum)
Subjective: CC: Feeling a lot better. No nausea or abdominal pain today. No prn pain meds yesterday. Feels less distended. Passing a large amount of flatus. No bm still. Mobilized yesterday. NGT output down from 2.75L > 470cc/24 hours. She denies any po intake (ice etc) yesterday.   VSS without fever, tachycardia or systolic hypotension. WBC still remains wnl at 7.0 this am. No AKI. K 3.2, Phos 1.6. NGT pulled back yesterday and re-advanced per RN note. Xray read pending this morning but on my read looks like she continues to have loops of dilated small bowel but does have contrast in her colon today. No prn pain meds yesterday.   Objective: Vital signs in last 24 hours: Temp:  [97.9 F (36.6 C)-98.6 F (37 C)] 97.9 F (36.6 C) (03/08 0721) Pulse Rate:  [58-65] 61 (03/08 0721) Resp:  [18-26] 20 (03/08 0721) BP: (109-119)/(40-53) 117/50 (03/08 0721) SpO2:  [92 %-100 %] 99 % (03/08 0721) Last BM Date : 01/24/23  Intake/Output from previous day: 03/07 0701 - 03/08 0700 In: 130 [NG/GT:130] Out: 470 [Emesis/NG output:470] Intake/Output this shift: No intake/output data recorded.  PE: Gen:  Alert, NAD, pleasant Pulm:  Rate and effort normal  Abd: Still mild distension but improved and very soft. She is completely NT with light and deep palpation today. There is no rigidity or guarding. Some bowel sounds. NGT in place with still thick bilious output in cannister but output in tubing is much thinner and more transparent.  Psych: A&Ox3   Lab Results:  Recent Labs    01/28/23 0424 01/29/23 0423  WBC 8.6 7.0  HGB 12.2 11.1*  HCT 35.3* 32.2*  PLT 182 178    BMET Recent Labs    01/28/23 0424 01/29/23 0423  NA 138 137  K 3.0* 3.2*  CL 98 107  CO2 30 24  GLUCOSE 128* 114*  BUN 13 6  CREATININE 0.51 0.43*  CALCIUM 8.1* 7.6*    PT/INR No results for input(s): "LABPROT", "INR" in the last 72 hours. CMP     Component Value Date/Time   NA 137 01/29/2023 0423   NA  138 10/13/2022 0919   K 3.2 (L) 01/29/2023 0423   CL 107 01/29/2023 0423   CO2 24 01/29/2023 0423   GLUCOSE 114 (H) 01/29/2023 0423   BUN 6 01/29/2023 0423   BUN 11 10/13/2022 0919   CREATININE 0.43 (L) 01/29/2023 0423   CALCIUM 7.6 (L) 01/29/2023 0423   PROT 6.0 (L) 01/27/2023 0422   PROT 6.7 10/13/2022 0919   ALBUMIN 2.8 (L) 01/27/2023 0422   ALBUMIN 4.1 10/13/2022 0919   AST 20 01/27/2023 0422   ALT 19 01/27/2023 0422   ALKPHOS 64 01/27/2023 0422   BILITOT 1.1 01/27/2023 0422   BILITOT 1.0 10/13/2022 0919   GFRNONAA >60 01/29/2023 0423   GFRAA 116 10/25/2020 0858   Lipase     Component Value Date/Time   LIPASE 28 01/25/2023 1504    Studies/Results: DG Abd Portable 1V  Result Date: 01/28/2023 CLINICAL DATA:  Confirm nasogastric tube placement. EXAM: PORTABLE ABDOMEN - 1 VIEW COMPARISON:  Radiograph earlier today, abdominal CT 01/25/2023 FINDINGS: Tip and side port of the enteric tube below the diaphragm in the stomach. Presumed contrast in the stomach and small bowel. Persistent gaseous small bowel distension. Gallstones are noted. IMPRESSION: Tip and side port of the enteric tube below the diaphragm in the stomach. Presumed contrast in the stomach and small bowel. Electronically Signed  By: Keith Rake M.D.   On: 01/28/2023 15:52   DG Abd Portable 1V  Result Date: 01/28/2023 CLINICAL DATA:  Abdominal pain. EXAM: PORTABLE ABDOMEN - 1 VIEW COMPARISON:  January 27, 2023. FINDINGS: Stable small bowel dilatation is noted concerning for distal small bowel obstruction. Nasogastric tube tip is seen in proximal stomach. No colonic dilatation is noted. IMPRESSION: Stable small bowel dilatation is noted concerning for distal small bowel obstruction. Electronically Signed   By: Marijo Conception M.D.   On: 01/28/2023 08:48    Anti-infectives: Anti-infectives (From admission, onward)    Start     Dose/Rate Route Frequency Ordered Stop   01/26/23 2200  azithromycin (ZITHROMAX) 250 mg in  dextrose 5 % 125 mL IVPB  Status:  Discontinued        250 mg 127.5 mL/hr over 60 Minutes Intravenous Every 24 hours 01/25/23 2146 01/28/23 1058   01/26/23 2200  cefTRIAXone (ROCEPHIN) 2 g in sodium chloride 0.9 % 100 mL IVPB  Status:  Discontinued        2 g 200 mL/hr over 30 Minutes Intravenous Every 24 hours 01/26/23 1129 01/28/23 1058   01/26/23 1000  metroNIDAZOLE (FLAGYL) IVPB 500 mg  Status:  Discontinued        500 mg 100 mL/hr over 60 Minutes Intravenous Every 12 hours 01/26/23 0917 01/27/23 1114   01/25/23 2145  cefTRIAXone (ROCEPHIN) 1 g in sodium chloride 0.9 % 100 mL IVPB  Status:  Discontinued        1 g 200 mL/hr over 30 Minutes Intravenous Every 24 hours 01/25/23 2146 01/26/23 1129   01/25/23 2145  azithromycin (ZITHROMAX) 500 mg in sodium chloride 0.9 % 250 mL IVPB        500 mg 250 mL/hr over 60 Minutes Intravenous  Once 01/25/23 2146 01/26/23 0046        Assessment/Plan SBO - History of C-section, hysterectomy, oophorectomy.  - CT on admission w/ High-grade small-bowel obstruction with transition point in the lower central abdomen with small amount of free fluid  - CTA chest ordered by the primary service 3/5 suggesting a concern for pneumoperitoneum. Evaluated by our team and actually looked improved. Recommended continued conservative management.  - VSS without fever, tachycardia or systolic hypotension. WBC wnl. Her nausea and abdominal pain have resolved. She is less distended and completely NT on exam today. No peritonitis. NGT output downtrending and she is now passing a large amount of flatus. Xray this am with final read pending. On my review, she does have some dilated small bowel still but also contrast in her colon. Given her clinical improvement I do not think she needs emergency surgery at this time. Will see how she does with NGT clamping trial and clears from the floor. If she tolerates this we can remove her NGT later today and try official liquid diet.  Would like to keep her in house for diet advancement to ensure she is tolerating solid diet before discharge. We discussed if she was to fail NGT clamping trial or diet advancement at any point, we would recommend exploratory surgery to see if there is anything that is still causing a partial obstruction. Patient and husband stated understanding and agreement with the plan. I communicated plan for NGT clamping and clears from the floor with patients RN.   FEN - Clamp NGT, Clears from the floor. Suppository. IVF per primary. Replace K (3.2) and phos (1.6) VTE - SCDs, okay for chem ppx from a general surgery  standpoint ID - None currently.   I reviewed nursing notes, hospitalist notes, last 24 h vitals and pain scores, last 48 h intake and output, last 24 h labs and trends, and last 24 h imaging results.   LOS: 3 days    Jillyn Ledger , [facility] 01/29/2023, 7:30 AM Please see Amion for pager number during day hours 7:00am-4:30pm

## 2023-01-29 NOTE — Progress Notes (Signed)
Peripherally Inserted Central Catheter Placement  The IV Nurse has discussed with the patient and/or persons authorized to consent for the patient, the purpose of this procedure and the potential benefits and risks involved with this procedure.  The benefits include less needle sticks, lab draws from the catheter, and the patient may be discharged home with the catheter. Risks include, but not limited to, infection, bleeding, blood clot (thrombus formation), and puncture of an artery; nerve damage and irregular heartbeat and possibility to perform a PICC exchange if needed/ordered by physician.  Alternatives to this procedure were also discussed.  Bard Power PICC patient education guide, fact sheet on infection prevention and patient information card has been provided to patient /or left at bedside.    PICC Placement Documentation  PICC Double Lumen 01/29/23 Right Brachial 36 cm 0 cm (Active)  Indication for Insertion or Continuance of Line Administration of hyperosmolar/irritating solutions (i.e. TPN, Vancomycin, etc.) 01/29/23 1831  Exposed Catheter (cm) 0 cm 01/29/23 1831  Site Assessment Clean, Dry, Intact 01/29/23 1831  Lumen #1 Status Flushed;Saline locked;Blood return noted 01/29/23 1831  Lumen #2 Status Flushed;Saline locked;Blood return noted 01/29/23 1831  Dressing Type Transparent;Securing device 01/29/23 1831  Dressing Status Antimicrobial disc in place;Clean, Dry, Intact 01/29/23 1831  Safety Lock Not Applicable [id number]  Line Care Connections checked and tightened 01/29/23 1831  Line Adjustment (NICU/IV Team Only) No 01/29/23 1831  Dressing Intervention New dressing 01/29/23 1831  Dressing Change Due 02/05/23 01/29/23 1831       Kraig Genis, Nicolette Bang 01/29/2023, 6:32 PM

## 2023-01-29 NOTE — Progress Notes (Addendum)
Initial Nutrition Assessment  DOCUMENTATION CODES:   Not applicable  INTERVENTION:   TPN management per Pharmacy Advance diet per surgery team  NUTRITION DIAGNOSIS:   Inadequate oral intake related to inability to eat as evidenced by NPO status.  GOAL:   Patient will meet greater than or equal to 90% of their needs  MONITOR:   Diet advancement, Labs, Weight trends, I & O's  REASON FOR ASSESSMENT:   Consult New TPN/TNA  ASSESSMENT:   62 y.o. female presented to the ED with nausea and vomiting. PMH includes HLD. Pt admitted with SBO, abnormal CT scan, cholelithiasis, and hyponatremia.   03/04 - NG tube placed  Pt laying in bed, family at bedside. Reports that her appetite was great prior to obstruction. Denies any current nausea or vomiting. Has been sipping on water, shared that MD plans to remove NG tube today. RD explained the typically advancement of diet once surgery is ok with it. Pt reports a UBW of 143#, unsure if she has lost any weight. Knows that they recently weighed her, but was unsure of what that weight was.   Discussed the use of TPN and what it contains. MD plan to start due to not able to eat in 4+ days and pt feeling like she has no energy. Pt still does not have PICC in place.  Per notes, pt passing more flatus, but still no BM. RD will monitor for diet advancements.  Medications reviewed and include: NovoLog SSI, Protonix, IV Potassium Phosphate, IV potassium chloride Labs reviewed: Sodium 137, Potassium 3.2 (L), Phosphorus 1.6 (L), Magnesium 2.0   NUTRITION - FOCUSED PHYSICAL EXAM:  Flowsheet Row Most Recent Value  Orbital Region No depletion  Upper Arm Region No depletion  Thoracic and Lumbar Region No depletion  Buccal Region No depletion  Temple Region No depletion  Clavicle Bone Region No depletion  Clavicle and Acromion Bone Region No depletion  Scapular Bone Region No depletion  Dorsal Hand No depletion  Patellar Region No depletion   Anterior Thigh Region No depletion  Posterior Calf Region No depletion  Edema (RD Assessment) None  Hair Unable to assess  Eyes Reviewed  Mouth Reviewed  Skin Reviewed  Nails Reviewed   Diet Order:   Diet Order             Diet NPO time specified Except for: Ice Chips, Other (See Comments)  Diet effective now                  EDUCATION NEEDS:   Education needs have been addressed  Skin:  Skin Assessment: Reviewed RN Assessment  Last BM:  3/3  Height:  Ht Readings from Last 1 Encounters:  01/25/23 '5\' 6"'$  (1.676 m)   Weight:  Wt Readings from Last 1 Encounters:  01/29/23 71.3 kg   Ideal Body Weight:  59.1 kg  BMI:  Body mass index is 25.37 kg/m.  Estimated Nutritional Needs:  Kcal:  1800-2000 Protein:  90-110 grams Fluid:  >/= 1.8 L   Hermina Barters RD, LDN Clinical Dietitian See [facility] for contact information.

## 2023-01-29 NOTE — Progress Notes (Incomplete)
PHARMACY - TOTAL PARENTERAL NUTRITION CONSULT NOTE   Indication: Small bowel obstruction  Patient Measurements: Height: '5\' 6"'$  (167.6 cm) Weight: 71.3 kg (157 lb 3 oz) IBW/kg (Calculated) : 59.3 TPN AdjBW (KG): 65.8 Body mass index is 25.37 kg/m. Usual Weight: 143 lb  Assessment: 28 yof who presented to the ED with 1 week of abd pain, N/V found to have a SBO. NGT was placed and she has had bilious output. Plan is to continue conservative management and avoid surgery if possible. She is passing flatus but has not had a BM. She has been NPO since admit and is feeling weak with no oral intake. Pharmacy consulted to begin TPN.   Glucose / Insulin: no hx DM, BG 114, on sSSI Electrolytes: K 3.2 (40 meq IV given + 44 meq from KPhos), Mg 2, Phos 1.6 (30 mmol KPhos given), CoCa 8.6 Renal: SCr <1 Hepatic: LFTs / Tbili WNL, albumin 2.8 Intake / Output; MIVF: NGT 470 ml output, D5NS 125 ml/hr  GI Imaging: GI Surgeries / Procedures:   Central access: PICC pending TPN start date: 3/9  Nutritional Goals: Goal TPN rate is 80 mL/hr (provides 90 g of protein and 1850 kcals per day)  RD Assessment: Estimated Needs Total Energy Estimated Needs: 1800-2000 Total Protein Estimated Needs: 90-110 grams Total Fluid Estimated Needs: >/= 1.8 L  Current Nutrition:  NPO  Plan:  Start TPN at 14m/hr at 1800 Electrolytes in TPN: Na 528m/L, K 508mL, Ca 5mE22m, Mg 5mEq65m and Phos 15mmo75m Cl:Ac 1:1 Add standard MVI and trace elements to TPN Initiate Sensitive q4h SSI and adjust as needed  Reduce MIVF to *** mL/hr at 1800 Monitor TPN labs on Mon/Thurs, ***

## 2023-01-29 NOTE — Progress Notes (Signed)
    Daily Progress Note Intern Pager: [phone]  Patient name: Madison Booker Medical record number: [id number] Date of birth: June 20, 1961 Age: 62 y.o. Gender: female  Primary Care Provider: Kinnie Feil, MD Consultants: General surgery Code Status: FULL  Pt Overview and Major Events to Date:  01/25/23 - Admitted   01/29/23 - NGT clamping trial   Assessment and Plan: Madison Booker is a 62 y.o. female admitted for SBO and additional concern for community acquired pneumonia.  Pertinent PMH/PSH includes multiple C-sections, partial hysterectomy, and oophorectomy.    * Small bowel obstruction ([place]) Gen surg attempting NGT clamping trial and clears. NGT removed yesterday evening. CBGs improved. Continues to have stable vitals and reassuring abdominal exam.  Continue co-management with General Surgery. Continue to watch closely. - Gen surg consulted, recs appreciated - attempting clears with NGT clamping trial, starting TPN tonight - D5NS at 120mL/hr - IV Tylenol PRN for pain  - AM BMP - Replete electrolytes as needed  Abnormal CT scan of lung Respiratory status stable. -blood cultures NGTD 4 days -Follow-up outpatient CXR   FEN/GI: attempting NGT clamping trial PPx: SCDs, Padua risk score of 0   Dispo: Pending clinical improvement  Subjective:  Reports continuing to feel better. Reports passing flatus and reports BM yesterday. Concerned re getting enough nutrition and when she will start TPN. Discussed with pharmacy TPN will start around 6pm tonight, relayed to nurse. Denies any abdominal pain, nausea, vomiting. Reports tolerating clears yesterday.   Objective: Temp:  [97.9 F (36.6 C)-98 F (36.7 C)] 98 F (36.7 C) (03/09 0415) Pulse Rate:  [61-92] 90 (03/09 0415) Resp:  [16-20] 19 (03/09 0415) BP: (113-143)/(50-77) 143/73 (03/09 0415) SpO2:  [90 %-99 %] 92 % (03/09 0415) Weight:  [71.3 kg] 71.3 kg (03/08 1300) Physical Exam: General: NAD, improved appearance  from prior.  Cardiovascular: RRR. Respiratory: Normal WOB on RA. CTAB.  Abdomen: Soft, non-tender, non-distended  Laboratory: Most recent CBC Lab Results  Component Value Date   WBC 7.0 01/29/2023   HGB 11.1 (L) 01/29/2023   HCT 32.2 (L) 01/29/2023   MCV 93.6 01/29/2023   PLT 178 01/29/2023   Most recent BMP    Latest Ref Rng & Units 01/30/2023    4:05 AM  BMP  Glucose 70 - 99 mg/dL 109   BUN 6 - 20 mg/dL <5   Creatinine 0.44 - 1.00 mg/dL 0.45   Sodium 135 - 145 mmol/L 137   Potassium 3.5 - 5.1 mmol/L 3.2   Chloride 98 - 111 mmol/L 106   CO2 22 - 32 mmol/L 28   Calcium 8.9 - 10.3 mg/dL 7.9    Other pertinent labs Mg 2.1 Phos 2.4   Rolanda Lundborg, MD 01/30/2023, 6:46 AM  PGY-1, [place] Intern pager: [phone], text pages welcome Secure chat group [place]

## 2023-01-29 NOTE — Progress Notes (Signed)
     Daily Progress Note Intern Pager: [phone]  Patient name: Madison Booker Medical record number: [id number] Date of birth: [birth year] Age: 62 y.o. Gender: female  Primary Care Provider: Kinnie Feil, MD Consultants: General Surgery Code Status: Full  Pt Overview and Major Events to Date:  -01/25/23 - Admitted   Assessment and Plan: ISSABELA Booker is a 62 y.o. female admitted for SBO and additional concern for community acquired pneumonia. Pertinent PMH/PSH includes multiple C-sections, partial hysterectomy, and oophorectomy.   * Small bowel obstruction ([facility]) On SBO protocol. CBGs improved. Continues to have stable vitals and reassuring abdominal exam. NG tube having less output. Continue co-management with General Surgery. Continue to watch closely. - Gen surg consulted, recs appreciated - NG suction off - NPO - D5NS at 121mL/hr - IV Tylenol PRN for pain  - AM BMP - Replete electrolytes as needed  Abnormal CT scan of lung Respiratory status stable. -blood cultures NGTD 4 days -Follow-up outpatient CXR    FEN/GI: NPO, 125 ml/hr D5NS PPx: SCDs, Padua risk score of 0  Dispo:Pending clinical improvement  Subjective:  Feels much better today. Has continued to pass flatus. Discussed her electrolyte repletion.   Objective: Temp:  [97.9 F (36.6 C)-98.6 F (37 C)] 97.9 F (36.6 C) (03/08 0721) Pulse Rate:  [58-65] 61 (03/08 0721) Resp:  [18-26] 20 (03/08 0721) BP: (109-119)/(40-53) 117/50 (03/08 0721) SpO2:  [97 %-100 %] 99 % (03/08 0721) Physical Exam: General: NAD, less ill appearing Cardiovascular: RRR, no murmurs, no peripheral edema Respiratory: normal WOB on RA, CTAB, no wheezes, ronchi or rales Abdomen: soft, NTTP, no rebound or guarding Extremities: Moving all 4 extremities equally  NG output: 470 over past 24hrs.    Laboratory: Most recent CBC Lab Results  Component Value Date   WBC 7.0 01/29/2023   HGB 11.1 (L) 01/29/2023   HCT 32.2  (L) 01/29/2023   MCV 93.6 01/29/2023   PLT 178 01/29/2023   Most recent BMP    Latest Ref Rng & Units 01/29/2023    4:23 AM  BMP  Glucose 70 - 99 mg/dL 114   BUN 6 - 20 mg/dL 6   Creatinine 0.44 - 1.00 mg/dL 0.43   Sodium 135 - 145 mmol/L 137   Potassium 3.5 - 5.1 mmol/L 3.2   Chloride 98 - 111 mmol/L 107   CO2 22 - 32 mmol/L 24   Calcium 8.9 - 10.3 mg/dL 7.6     Other pertinent labs: none.   Imaging/Diagnostic Tests: KUB 03/08 Radiologist Impression: Continued small bowel dilatation concerning for distal small bowel obstruction. Residual contrast is noted in nondilated colon. My interpretation: interval change less bowel distension than previous Salvadore Oxford, MD 01/29/2023, 8:50 AM  PGY-1, [place] Intern pager: [phone], text pages welcome Secure chat group [place]

## 2023-01-30 ENCOUNTER — Inpatient Hospital Stay (HOSPITAL_COMMUNITY): Payer: BC Managed Care – PPO

## 2023-01-30 DIAGNOSIS — K56609 Unspecified intestinal obstruction, unspecified as to partial versus complete obstruction: Secondary | ICD-10-CM | POA: Diagnosis not present

## 2023-01-30 LAB — CULTURE, BLOOD (ROUTINE X 2)
Culture: NO GROWTH
Culture: NO GROWTH
Special Requests: ADEQUATE
Special Requests: ADEQUATE

## 2023-01-30 LAB — COMPREHENSIVE METABOLIC PANEL
ALT: 18 U/L (ref 0–44)
AST: 21 U/L (ref 15–41)
Albumin: 2.5 g/dL — ABNORMAL LOW (ref 3.5–5.0)
Alkaline Phosphatase: 49 U/L (ref 38–126)
Anion gap: 3 — ABNORMAL LOW (ref 5–15)
BUN: <5 mg/dL — ABNORMAL LOW (ref 6–20)
CO2: 28 mmol/L (ref 22–32)
Calcium: 7.9 mg/dL — ABNORMAL LOW (ref 8.9–10.3)
Chloride: 106 mmol/L (ref 98–111)
Creatinine, Ser: 0.45 mg/dL (ref 0.44–1.00)
GFR, Estimated: >60 mL/min (ref >60)
Glucose, Bld: 109 mg/dL — ABNORMAL HIGH (ref 70–99)
Potassium: 3.2 mmol/L — ABNORMAL LOW (ref 3.5–5.1)
Sodium: 137 mmol/L (ref 135–145)
Total Bilirubin: 0.8 mg/dL (ref 0.3–1.2)
Total Protein: 5.3 g/dL — ABNORMAL LOW (ref 6.5–8.1)

## 2023-01-30 LAB — GLUCOSE, CAPILLARY
Glucose-Capillary: 100 mg/dL — ABNORMAL HIGH (ref 70–99)
Glucose-Capillary: 117 mg/dL — ABNORMAL HIGH (ref 70–99)
Glucose-Capillary: 144 mg/dL — ABNORMAL HIGH (ref 70–99)
Glucose-Capillary: 74 mg/dL (ref 70–99)
Glucose-Capillary: 85 mg/dL (ref 70–99)

## 2023-01-30 LAB — TRIGLYCERIDES: Triglycerides: 71 mg/dL (ref <150)

## 2023-01-30 LAB — MAGNESIUM: Magnesium: 2.1 mg/dL (ref 1.7–2.4)

## 2023-01-30 LAB — PHOSPHORUS: Phosphorus: 2.4 mg/dL — ABNORMAL LOW (ref 2.5–4.6)

## 2023-01-30 MED ORDER — INSULIN ASPART 100 UNIT/ML IJ SOLN: 0.0000 [IU] | Freq: Four times a day (QID) | INTRAMUSCULAR | Status: DC

## 2023-01-30 MED ORDER — TRAVASOL 10 % IV SOLN
INTRAVENOUS | Status: DC
  Filled 2023-01-30: qty 460.8

## 2023-01-30 MED ORDER — DEXTROSE-NACL 5-0.9 % IV SOLN: INTRAVENOUS | Status: DC

## 2023-01-30 MED ORDER — POTASSIUM CHLORIDE 10 MEQ/100ML IV SOLN
10.0000 meq | INTRAVENOUS | Status: AC
  Administered 2023-01-30 (×4): 10 meq via INTRAVENOUS
  Filled 2023-01-30 (×4): qty 100

## 2023-01-30 MED ORDER — POTASSIUM PHOSPHATES 15 MMOLE/5ML IV SOLN
15.0000 mmol | Freq: Once | INTRAVENOUS | Status: AC
  Administered 2023-01-30: 15 mmol via INTRAVENOUS
  Filled 2023-01-30: qty 5

## 2023-01-30 NOTE — Progress Notes (Signed)
Subjective: CC: Feeling a lot better. NGT out last night. Reports no po intake yesterday/today. No nausea, vomiting, burping/belching or abdominal pain today. No prn pain meds yesterday. Feels less distended. Passing flatus. Soft BM yesterday after suppository. None since then.   Objective: Vital signs in last 24 hours: Temp:  [97.9 F (36.6 C)-98.1 F (36.7 C)] 98.1 F (36.7 C) (03/09 0828) Pulse Rate:  [62-92] 62 (03/09 0828) Resp:  [16-20] 20 (03/09 0828) BP: (113-143)/(68-77) 133/72 (03/09 0828) SpO2:  [90 %-96 %] 96 % (03/09 0828) Weight:  [71.3 kg] 71.3 kg (03/08 1300) Last BM Date : 01/24/23  Intake/Output from previous day: No intake/output data recorded. Intake/Output this shift: No intake/output data recorded.  PE: Gen:  Alert, NAD, pleasant Pulm:  Rate and effort normal  Abd: Still mild lower abdominal distension but overall improved and very soft. She is completely NT with light and deep palpation today. There is no rigidity or guarding. + bowel sounds.  Psych: A&Ox3   Lab Results:  Recent Labs    01/28/23 0424 01/29/23 0423  WBC 8.6 7.0  HGB 12.2 11.1*  HCT 35.3* 32.2*  PLT 182 178    BMET Recent Labs    01/29/23 0423 01/30/23 0405  NA 137 137  K 3.2* 3.2*  CL 107 106  CO2 24 28  GLUCOSE 114* 109*  BUN 6 <5*  CREATININE 0.43* 0.45  CALCIUM 7.6* 7.9*    PT/INR No results for input(s): "LABPROT", "INR" in the last 72 hours. CMP     Component Value Date/Time   NA 137 01/30/2023 0405   NA 138 10/13/2022 0919   K 3.2 (L) 01/30/2023 0405   CL 106 01/30/2023 0405   CO2 28 01/30/2023 0405   GLUCOSE 109 (H) 01/30/2023 0405   BUN <5 (L) 01/30/2023 0405   BUN 11 10/13/2022 0919   CREATININE 0.45 01/30/2023 0405   CALCIUM 7.9 (L) 01/30/2023 0405   PROT 5.3 (L) 01/30/2023 0405   PROT 6.7 10/13/2022 0919   ALBUMIN 2.5 (L) 01/30/2023 0405   ALBUMIN 4.1 10/13/2022 0919   AST 21 01/30/2023 0405   ALT 18 01/30/2023 0405   ALKPHOS 49  01/30/2023 0405   BILITOT 0.8 01/30/2023 0405   BILITOT 1.0 10/13/2022 0919   GFRNONAA >60 01/30/2023 0405   GFRAA 116 10/25/2020 0858   Lipase     Component Value Date/Time   LIPASE 28 01/25/2023 1504    Studies/Results: DG Abd Portable 1V  Result Date: 01/30/2023 CLINICAL DATA:  Small-bowel obstruction. EXAM: PORTABLE ABDOMEN - 1 VIEW COMPARISON:  01/29/2023. FINDINGS: Slight interval improvement in gaseous distention of multiple small bowel loops throughout the abdomen. Retained contrast is seen throughout the colon. IMPRESSION: Slightly improved dilation of the small bowel. Retained enteric contrast is seen throughout the colon. Electronically Signed   By: Emmit Alexanders M.D.   On: 01/30/2023 09:24   [place] EKG SITE RITE  Result Date: 01/29/2023 If Site Rite image not attached, placement could not be confirmed due to current cardiac rhythm.  DG Abd Portable 1V  Result Date: 01/29/2023 CLINICAL DATA:  Small bowel obstruction. EXAM: PORTABLE ABDOMEN - 1 VIEW COMPARISON:  January 28, 2023. FINDINGS: There is continued small bowel dilatation concerning for distal small bowel obstruction. Residual contrast is noted in nondilated colon. Nasogastric tube tip is seen in expected position of distal stomach. IMPRESSION: Continued small bowel dilatation concerning for distal small bowel obstruction. Residual contrast is noted in nondilated colon.  Electronically Signed   By: Marijo Conception M.D.   On: 01/29/2023 08:09   DG Abd Portable 1V  Result Date: 01/28/2023 CLINICAL DATA:  Confirm nasogastric tube placement. EXAM: PORTABLE ABDOMEN - 1 VIEW COMPARISON:  Radiograph earlier today, abdominal CT 01/25/2023 FINDINGS: Tip and side port of the enteric tube below the diaphragm in the stomach. Presumed contrast in the stomach and small bowel. Persistent gaseous small bowel distension. Gallstones are noted. IMPRESSION: Tip and side port of the enteric tube below the diaphragm in the stomach. Presumed contrast in  the stomach and small bowel. Electronically Signed   By: Keith Rake M.D.   On: 01/28/2023 15:52    Anti-infectives: Anti-infectives (From admission, onward)    Start     Dose/Rate Route Frequency Ordered Stop   01/26/23 2200  azithromycin (ZITHROMAX) 250 mg in dextrose 5 % 125 mL IVPB  Status:  Discontinued        250 mg 127.5 mL/hr over 60 Minutes Intravenous Every 24 hours 01/25/23 2146 01/28/23 1058   01/26/23 2200  cefTRIAXone (ROCEPHIN) 2 g in sodium chloride 0.9 % 100 mL IVPB  Status:  Discontinued        2 g 200 mL/hr over 30 Minutes Intravenous Every 24 hours 01/26/23 1129 01/28/23 1058   01/26/23 1000  metroNIDAZOLE (FLAGYL) IVPB 500 mg  Status:  Discontinued        500 mg 100 mL/hr over 60 Minutes Intravenous Every 12 hours 01/26/23 0917 01/27/23 1114   01/25/23 2145  cefTRIAXone (ROCEPHIN) 1 g in sodium chloride 0.9 % 100 mL IVPB  Status:  Discontinued        1 g 200 mL/hr over 30 Minutes Intravenous Every 24 hours 01/25/23 2146 01/26/23 1129   01/25/23 2145  azithromycin (ZITHROMAX) 500 mg in sodium chloride 0.9 % 250 mL IVPB        500 mg 250 mL/hr over 60 Minutes Intravenous  Once 01/25/23 2146 01/26/23 0046        Assessment/Plan SBO - History of C-section, hysterectomy, oophorectomy.  - CT on admission w/ High-grade small-bowel obstruction with transition point in the lower central abdomen with small amount of free fluid  - CTA chest ordered by the primary service 3/5 suggesting a concern for pneumoperitoneum. Evaluated by our team and actually looked improved. Recommended continued conservative management.  - VSS without fever, tachycardia or systolic hypotension. WBC wnl yesterday. Clinically improving as noted above. Her abdominal exam is reassuring with no ttp and no peritonitis. Xray this am with improved dilated small bowel and contrast in her colon. Given her clinical improvement I do not think she needs emergency surgery at this time. Will see how she  does with clears. Would like to keep her in house for diet advancement to ensure she is tolerating solid diet and having bowel function before discharge. We discussed if she was to fail diet advancement at any point, we would recommend exploratory surgery to see if there is anything that is still causing a partial obstruction. We will follow with you.   FEN - CLD. She was started on TPN 3/8 VTE - SCDs, okay for chem ppx from a general surgery standpoint ID - None currently.   I reviewed nursing notes, hospitalist notes, last 24 h vitals and pain scores, last 48 h intake and output, last 24 h labs and trends, and last 24 h imaging results.   LOS: 4 days    Jillyn Ledger , PA-C [facility] 01/30/2023, 9:42 AM Please see Amion for pager number during day hours 7:00am-4:30pm

## 2023-01-30 NOTE — Progress Notes (Signed)
PHARMACY - TOTAL PARENTERAL NUTRITION CONSULT NOTE  Indication: Small bowel obstruction  Patient Measurements: Height: '5\' 6"'$  (167.6 cm) Weight: 71.3 kg (157 lb 3 oz) IBW/kg (Calculated) : 59.3 TPN AdjBW (KG): 65.8 Body mass index is 25.37 kg/m. Usual Weight: 143 lb  Assessment:  39 yof who presented to the ED with 1 week of abd pain, N/V found to have a SBO. NGT was placed and she has had bilious output. Plan is to continue conservative management and avoid surgery if possible. She is passing flatus but has not had a BM. She has been NPO since admit and is feeling weak with no oral intake. Pharmacy consulted to begin TPN.   Glucose / Insulin: no hx DM - CBGs < 180 Electrolytes: K 3.2 and Phos up to 2.4 post KCL x 4 runs and KPhos 80mol IV, others WNL Renal: SCr < 1, BUN WNL Hepatic: LFTs / tbili / TG WNL, albumin 2.5 Intake / Output; MIVF: NG output not documented, D5NS at 125 ml/hr  GI Imaging: none since TPN initiation GI Surgeries / Procedures: none since TPN initiation  Central access: PICC placed 01/29/23 TPN start date: 01/30/23  Nutritional Goals:  RD Estimated Needs Total Energy Estimated Needs: 1800-2000 Total Protein Estimated Needs: 90-110 grams Total Fluid Estimated Needs: >/= 1.8 L  Current Nutrition:  NPO  Plan:  Start TPN at 435mhr at 1800 (goal rate 80 ml/hr) Electrolytes in TPN: Na 5067mL, K 53m35m, Ca 5mEq43m Mg 5mEq/82mPhos 15mmol15mCl:Ac 1:1 Add standard MVI and trace elements to TPN Initiate sensitive SSI Q6H KCL x 4 runs and KPhos 15mmol 47mer MD Reduce D5NS to 85 mL/hr at 1800 Monitor TPN labs on Mon/Thurs, labs in AM  Vedh Ptacek D. Taima Rada, PhMina Marble, BCPS, BCCCP 3/[place]4, 7:33 AM

## 2023-01-30 NOTE — Progress Notes (Signed)
IV team nurse showed up to give patient TPN as ordered and discussed by physician and patient. Patient ultimately refused TPN as she states that she is feeling better and does not need TPN. Mds Jerilee Hoh and [place] on [facility] Medicine team notified stated that patient need to remain on clears if she is going to refuse TPN.

## 2023-01-30 NOTE — Plan of Care (Signed)

## 2023-01-30 NOTE — Progress Notes (Addendum)
     Daily Progress Note Intern Pager: [phone]  Patient name: Madison Booker Medical record number: [id number] Date of birth: [birth year] Age: 62 y.o. Gender: female  Primary Care Provider: Anders Otto DASEN, MD Consultants: General Surgery Code Status: FULL  Pt Overview and Major Events to Date:  01/25/23: Admitted 01/29/23: NGT clamp trial  Assessment and Plan: Madison Booker is a 62 y.o. female admitted for small bowel obstruction, receiving non-surgical treatment with NG tube. Pertinent PMH/PSH includes abdominal surgeries (Cesarean section x1, partial hysterectomy, oophorectomy)  * Small bowel obstruction (HCC) Tolerating clears well.  Abdomen soft and nontender, bowel sounds appreciated on exam.  Continues to have stable vitals and reassuring abdominal exam.  Continue co-management with General Surgery. Pain well-controlled and has not required any as needed morphine  in the past 24 hours. - Gen surg following, appreciate care -Will likely attempt advancing diet, although leaving this up to general surgery.  Attempting clears with NGT clamping trial - D5NS at 85 mL/hr - IV morphine  PRN for pain  - Replete electrolytes as needed  Abnormal CT scan of lung Respiratory status stable on room air. -Follow-up outpatient CXR    FEN/GI: Clears PPx: SCDs, Padua risk score 0 Dispo:Home pending clinical improvement . Barriers include continued IV management, evaluating for clinical improvement.   Subjective:  Denies any pain or complaints. Tolerating clears well (broth, popscicles, jello). Says she had 4 bowel movements yesterday during the day. Passed flatus overnight.  Wonders when she is getting her PICC line taken out as she is not getting any TPN, but I discussed that with keeping in place in case she needs it.  Objective: Temp:  [97.9 F (36.6 C)-98.3 F (36.8 C)] 98 F (36.7 C) (03/10 0440) Pulse Rate:  [62-66] 66 (03/09 1933) Resp:  [16-20] 19 (03/10 0440) BP:  (133-156)/(67-78) 156/67 (03/10 0440) SpO2:  [96 %-100 %] 100 % (03/10 0440) Physical Exam: General: Sitting upright in bed, no distress Cardiovascular: RRR, no m/r/g Respiratory: Normal WOB on room air. CTAB. Abdomen: Soft, NTND, normal bowel sounds appreciated Extremities: Warm, dry, well-perfused  Laboratory: Most recent CBC Lab Results  Component Value Date   WBC 7.0 01/29/2023   HGB 11.1 (L) 01/29/2023   HCT 32.2 (L) 01/29/2023   MCV 93.6 01/29/2023   PLT 178 01/29/2023   Most recent BMP    Latest Ref Rng & Units 01/31/2023    3:46 AM  BMP  Glucose 70 - 99 mg/dL 96   BUN 6 - 20 mg/dL <5   Creatinine 9.55 - 1.00 mg/dL 9.58   Sodium 864 - 854 mmol/L 139   Potassium 3.5 - 5.1 mmol/L 3.6   Chloride 98 - 111 mmol/L 108   CO2 22 - 32 mmol/L 24   Calcium 8.9 - 10.3 mg/dL 8.3     Dartha Geralds, DO 01/31/2023, 6:28 AM  PGY-2, [facility] FPTS Intern pager: [phone], text pages welcome Secure chat group [facility] Teaching Service

## 2023-01-31 DIAGNOSIS — E876 Hypokalemia: Secondary | ICD-10-CM | POA: Diagnosis not present

## 2023-01-31 DIAGNOSIS — K56609 Unspecified intestinal obstruction, unspecified as to partial versus complete obstruction: Secondary | ICD-10-CM | POA: Diagnosis not present

## 2023-01-31 LAB — BASIC METABOLIC PANEL
Anion gap: 7 (ref 5–15)
BUN: <5 mg/dL — ABNORMAL LOW (ref 6–20)
CO2: 24 mmol/L (ref 22–32)
Calcium: 8.3 mg/dL — ABNORMAL LOW (ref 8.9–10.3)
Chloride: 108 mmol/L (ref 98–111)
Creatinine, Ser: 0.41 mg/dL — ABNORMAL LOW (ref 0.44–1.00)
GFR, Estimated: >60 mL/min (ref >60)
Glucose, Bld: 96 mg/dL (ref 70–99)
Potassium: 3.6 mmol/L (ref 3.5–5.1)
Sodium: 139 mmol/L (ref 135–145)

## 2023-01-31 LAB — GLUCOSE, CAPILLARY: Glucose-Capillary: 96 mg/dL (ref 70–99)

## 2023-01-31 LAB — PHOSPHORUS: Phosphorus: 2.4 mg/dL — ABNORMAL LOW (ref 2.5–4.6)

## 2023-01-31 LAB — MAGNESIUM: Magnesium: 2 mg/dL (ref 1.7–2.4)

## 2023-01-31 MED ORDER — POTASSIUM PHOSPHATES 15 MMOLE/5ML IV SOLN
30.0000 mmol | Freq: Once | INTRAVENOUS | Status: AC
  Administered 2023-01-31: 30 mmol via INTRAVENOUS
  Filled 2023-01-31: qty 10

## 2023-01-31 NOTE — Progress Notes (Signed)
Subjective: CC: Doing well. Tolerating cld without abdominal pain, n/v. Does not feel distended. Passing flatus. 3-4 bm's yesterday.   Objective: Vital signs in last 24 hours: Temp:  [97.9 F (36.6 C)-98.3 F (36.8 C)] 98 F (36.7 C) (03/10 0440) Pulse Rate:  [63-66] 66 (03/09 1933) Resp:  [16-20] 19 (03/10 0440) BP: (146-156)/(67-78) 156/67 (03/10 0440) SpO2:  [100 %] 100 % (03/10 0440) Last BM Date : 01/30/23  Intake/Output from previous day: No intake/output data recorded. Intake/Output this shift: No intake/output data recorded.  PE: Gen:  Alert, NAD, pleasant Pulm:  Rate and effort normal  Abd: Soft, ND, NT, +BS Psych: A&Ox3   Lab Results:  Recent Labs    01/29/23 0423  WBC 7.0  HGB 11.1*  HCT 32.2*  PLT 178    BMET Recent Labs    01/30/23 0405 01/31/23 0346  NA 137 139  K 3.2* 3.6  CL 106 108  CO2 28 24  GLUCOSE 109* 96  BUN <5* <5*  CREATININE 0.45 0.41*  CALCIUM 7.9* 8.3*    PT/INR No results for input(s): "LABPROT", "INR" in the last 72 hours. CMP     Component Value Date/Time   NA 139 01/31/2023 0346   NA 138 10/13/2022 0919   K 3.6 01/31/2023 0346   CL 108 01/31/2023 0346   CO2 24 01/31/2023 0346   GLUCOSE 96 01/31/2023 0346   BUN <5 (L) 01/31/2023 0346   BUN 11 10/13/2022 0919   CREATININE 0.41 (L) 01/31/2023 0346   CALCIUM 8.3 (L) 01/31/2023 0346   PROT 5.3 (L) 01/30/2023 0405   PROT 6.7 10/13/2022 0919   ALBUMIN 2.5 (L) 01/30/2023 0405   ALBUMIN 4.1 10/13/2022 0919   AST 21 01/30/2023 0405   ALT 18 01/30/2023 0405   ALKPHOS 49 01/30/2023 0405   BILITOT 0.8 01/30/2023 0405   BILITOT 1.0 10/13/2022 0919   GFRNONAA >60 01/31/2023 0346   GFRAA 116 10/25/2020 0858   Lipase     Component Value Date/Time   LIPASE 28 01/25/2023 1504    Studies/Results: DG Abd Portable 1V  Result Date: 01/30/2023 CLINICAL DATA:  Small-bowel obstruction. EXAM: PORTABLE ABDOMEN - 1 VIEW COMPARISON:  01/29/2023. FINDINGS: Slight  interval improvement in gaseous distention of multiple small bowel loops throughout the abdomen. Retained contrast is seen throughout the colon. IMPRESSION: Slightly improved dilation of the small bowel. Retained enteric contrast is seen throughout the colon. Electronically Signed   By: Emmit Alexanders M.D.   On: 01/30/2023 09:24   [place] EKG SITE RITE  Result Date: 01/29/2023 If Site Rite image not attached, placement could not be confirmed due to current cardiac rhythm.   Anti-infectives: Anti-infectives (From admission, onward)    Start     Dose/Rate Route Frequency Ordered Stop   01/26/23 2200  azithromycin (ZITHROMAX) 250 mg in dextrose 5 % 125 mL IVPB  Status:  Discontinued        250 mg 127.5 mL/hr over 60 Minutes Intravenous Every 24 hours 01/25/23 2146 01/28/23 1058   01/26/23 2200  cefTRIAXone (ROCEPHIN) 2 g in sodium chloride 0.9 % 100 mL IVPB  Status:  Discontinued        2 g 200 mL/hr over 30 Minutes Intravenous Every 24 hours 01/26/23 1129 01/28/23 1058   01/26/23 1000  metroNIDAZOLE (FLAGYL) IVPB 500 mg  Status:  Discontinued        500 mg 100 mL/hr over 60 Minutes Intravenous Every 12 hours 01/26/23 0917 01/27/23  1114   01/25/23 2145  cefTRIAXone (ROCEPHIN) 1 g in sodium chloride 0.9 % 100 mL IVPB  Status:  Discontinued        1 g 200 mL/hr over 30 Minutes Intravenous Every 24 hours 01/25/23 2146 01/26/23 1129   01/25/23 2145  azithromycin (ZITHROMAX) 500 mg in sodium chloride 0.9 % 250 mL IVPB        500 mg 250 mL/hr over 60 Minutes Intravenous  Once 01/25/23 2146 01/26/23 0046        Assessment/Plan SBO - History of C-section, hysterectomy, oophorectomy.  - CT on admission w/ High-grade small-bowel obstruction with transition point in the lower central abdomen with small amount of free fluid  - CTA chest ordered by the primary service 3/5 suggesting a concern for pneumoperitoneum. Evaluated by our team and actually looked improved. Recommended continued conservative  management.  - VSS without fever, tachycardia or systolic hypotension. WBC wnl on last check. Clinically improving as noted above. Her abdominal exam is reassuring with no ttp and no peritonitis. Prior xrays with contrast in her colon. Given her clinical improvement I do not think she needs emergency surgery at this time. Will see how she does with diet advancement. FLD now. Can advance to soft diet as tolerated.. Would like to keep her in house for diet advancement to ensure she is tolerating solid diet and having bowel function before discharge. We discussed if she was to fail diet advancement at any point, we would recommend exploratory surgery to see if there is anything that is still causing a partial obstruction. We will follow with you.   Advance diet as tolerated. If tolerating diet and having bowel function may be able to go home later today vs am.   FEN - FLD, ADAT VTE - SCDs, okay for chem ppx from a general surgery standpoint ID - None currently. Recommend PICC line removal before d/c   I reviewed nursing notes, hospitalist notes, last 24 h vitals and pain scores, last 48 h intake and output, last 24 h labs and trends, and last 24 h imaging results.   LOS: 5 days    Jillyn Ledger , [facility] Surgery 01/31/2023, 9:46 AM Please see Amion for pager number during day hours 7:00am-4:30pm

## 2023-01-31 NOTE — Progress Notes (Addendum)
PHARMACY - TOTAL PARENTERAL NUTRITION CONSULT NOTE  Indication: Small bowel obstruction  Patient Measurements: Height: '5\' 6"'$  (167.6 cm) Weight: 71.3 kg (157 lb 3 oz) IBW/kg (Calculated) : 59.3 TPN AdjBW (KG): 65.8 Body mass index is 25.37 kg/m. Usual Weight: 143 lb  Assessment:  3 yof who presented to the ED with 1 week of abd pain, N/V found to have a SBO. NGT was placed and she has had bilious output. Plan is to continue conservative management and avoid surgery if possible. She is passing flatus but has not had a BM. She has been NPO since admit and is feeling weak with no oral intake. Pharmacy consulted to manage TPN.  Patient refused TPN 3/9 because she was doing better.  Glucose / Insulin: no hx DM - CBGs < 180 Electrolytes: K up to 3.6 and Phos remains at 2.4 post KCL x 4 runs and KPhos 37mol IV, others WNL Renal: SCr < 1, BUN WNL Hepatic: LFTs / tbili / TG WNL, albumin 2.5, LBM 3/9 Intake / Output; MIVF: NG output not documented, D5NS at 125 ml/hr  GI Imaging: none since TPN initiation GI Surgeries / Procedures: none since TPN initiation  Central access: PICC placed 01/29/23 TPN start date: 01/30/23  Nutritional Goals:  RD Estimated Needs Total Energy Estimated Needs: 1800-2000 Total Protein Estimated Needs: 90-110 grams Total Fluid Estimated Needs: >/= 1.8 L  Current Nutrition:  TPN  Plan:  D/C TPN consult per discussion with Surgery Increase D5NS back to 125 ml/hr since TPN wasn't started D/C SSI/CBG checks D/C TPN labs and nursing care orders KPhos 30 mmol IV - labs in AM if still here, further repletion per MD  TRemigio EisenmengerD. DMina Marble PharmD, BCPS, [place]ia[place]3/08/2023, 10:27 AM

## 2023-02-01 LAB — PHOSPHORUS: Phosphorus: 3 mg/dL (ref 2.5–4.6)

## 2023-02-01 LAB — BASIC METABOLIC PANEL
Anion gap: 8 (ref 5–15)
BUN: <5 mg/dL — ABNORMAL LOW (ref 6–20)
CO2: 25 mmol/L (ref 22–32)
Calcium: 7.9 mg/dL — ABNORMAL LOW (ref 8.9–10.3)
Chloride: 106 mmol/L (ref 98–111)
Creatinine, Ser: 0.53 mg/dL (ref 0.44–1.00)
GFR, Estimated: >60 mL/min (ref >60)
Glucose, Bld: 110 mg/dL — ABNORMAL HIGH (ref 70–99)
Potassium: 3.5 mmol/L (ref 3.5–5.1)
Sodium: 139 mmol/L (ref 135–145)

## 2023-02-01 NOTE — Discharge Instructions (Addendum)
Dear Madison Booker,   Thank you so much for allowing [place] to be part of your care!  You were admitted to [facility] for a small bowel obstruction and I am glad you are feeling better! Attached is a list of soft foods.   POST-HOSPITAL & CARE INSTRUCTIONS Please let PCP/Specialists know of any changes that were made.  Please see medications section of this packet for any medication changes.   DOCTOR'S APPOINTMENT & FOLLOW UP CARE INSTRUCTIONS  Future Appointments  Date Time Provider [place]  02/05/2023 10:30 AM Kinnie Feil, MD [facility]    RETURN PRECAUTIONS: If you have increased abdominal pain and are unable to keep food down.   Take care and be well!  [facility]  [place], [place] [address] [phone]

## 2023-02-01 NOTE — Progress Notes (Signed)
Patient ID: Madison Booker, female   DOB: Jul 07, 1961, 62 y.o.   MRN: [id number]      Subjective: Tolerated diet and moving bowels, reports stool is firming up ROS negative except as listed above. Objective: Vital signs in last 24 hours: Temp:  [98.1 F (36.7 C)] 98.1 F (36.7 C) (03/11 0742) Pulse Rate:  [76] 76 (03/11 0742) Resp:  [18] 18 (03/10 1956) BP: (116-121)/(63-67) 116/67 (03/11 0742) SpO2:  [100 %] 100 % (03/11 0742) Last BM Date : 01/31/23  Intake/Output from previous day: No intake/output data recorded. Intake/Output this shift: No intake/output data recorded.  General appearance: alert and cooperative Resp: clear to auscultation bilaterally GI: soft, NT, ND  Lab Results: CBC  No results for input(s): "WBC", "HGB", "HCT", "PLT" in the last 72 hours. BMET Recent Labs    01/31/23 0346 02/01/23 0328  NA 139 139  K 3.6 3.5  CL 108 106  CO2 24 25  GLUCOSE 96 110*  BUN <5* <5*  CREATININE 0.41* 0.53  CALCIUM 8.3* 7.9*   PT/INR No results for input(s): "LABPROT", "INR" in the last 72 hours. ABG No results for input(s): "PHART", "HCO3" in the last 72 hours.  Invalid input(s): "PCO2", "PO2"  Studies/Results: No results found.  Anti-infectives: Anti-infectives (From admission, onward)    Start     Dose/Rate Route Frequency Ordered Stop   01/26/23 2200  azithromycin (ZITHROMAX) 250 mg in dextrose 5 % 125 mL IVPB  Status:  Discontinued        250 mg 127.5 mL/hr over 60 Minutes Intravenous Every 24 hours 01/25/23 2146 01/28/23 1058   01/26/23 2200  cefTRIAXone (ROCEPHIN) 2 g in sodium chloride 0.9 % 100 mL IVPB  Status:  Discontinued        2 g 200 mL/hr over 30 Minutes Intravenous Every 24 hours 01/26/23 1129 01/28/23 1058   01/26/23 1000  metroNIDAZOLE (FLAGYL) IVPB 500 mg  Status:  Discontinued        500 mg 100 mL/hr over 60 Minutes Intravenous Every 12 hours 01/26/23 0917 01/27/23 1114   01/25/23 2145  cefTRIAXone (ROCEPHIN) 1 g in sodium  chloride 0.9 % 100 mL IVPB  Status:  Discontinued        1 g 200 mL/hr over 30 Minutes Intravenous Every 24 hours 01/25/23 2146 01/26/23 1129   01/25/23 2145  azithromycin (ZITHROMAX) 500 mg in sodium chloride 0.9 % 250 mL IVPB        500 mg 250 mL/hr over 60 Minutes Intravenous  Once 01/25/23 2146 01/26/23 0046       Assessment/Plan: SBO - History of C-section, hysterectomy, oophorectomy.  - CT on admission w/ High-grade small-bowel obstruction with transition point in the lower central abdomen with small amount of free fluid  - SBO has resolved and she is doing well. OK for D/C from our standpoint.   FEN - FLD, ADAT VTE - SCDs, okay for chem ppx from a general surgery standpoint ID - None currently. Recommend PICC line removal before d/c    LOS: 6 days    Georganna Skeans, MD, MPH, FACS Trauma & General Surgery Use [email or web address] to contact on call provider  02/01/2023

## 2023-02-01 NOTE — Progress Notes (Signed)
Pt is being discharged per MD order. Discharge instructions are provided to the pt and her husband. All questions answered.

## 2023-02-01 NOTE — Discharge Summary (Signed)
[facility] Discharge Summary  Patient name: Madison Booker Medical record number: [id number] Date of birth: 12/10/1960 Age: 62 y.o. Gender: female Date of Admission: 01/25/2023  Date of Discharge: 02/01/23 Admitting Physician: Eppie Gibson, MD  Primary Care Provider: Kinnie Feil, MD Consultants: general surgery  Indication for Hospitalization: SBO  Brief Hospital Course:  Madison Booker is a 62 y.o.female with a history of C-section and abdominal hysterectomy who was admitted to the family medicine teaching Service at [facility] for small bowel obstruction. Her hospital course is detailed below:  Small Bowel Obstruction Patient admitted after presenting to PCP with three days of nausea/vomiting and bloating. Found to have marked small bowel dilatation on KUB obtained as an outpatient so was directed to ED for possible SBO. Imaging in the ED confirmed high-grade SBO. NG was placed in the ED and general surgery was consulted.  Throughout admission neurosurgery watch closely and recommended conservative management.  Patient clinically improved and NGT was removed on 3/8.  Patient tolerated well and began clear liquid diet.  At discharge patient was having bowel movements and tolerating a soft diet  Community Acquired Pneumonia Imaging findings noted on CT Abdomen/Pelvis consistent with a RLL pneumonia. Initially thought to be an incidental finding but patient became increasingly tachycardic and tachypneic so CAP coverage was added with ceftriaxone and azithromycin.  Patient's vitals resolved to normal and blood cultures drawn in the ED were negative for greater than 48 hours.  Ceftriaxone and azithromycin discontinued.  Patient remained afebrile and stable on room air until discharge.  Items for PCP follow-up Recommend repeat 2-view CXR in 4-6 weeks to ensure resolution of RLL pneumonia.  Recheck BMP at follow up, patient hyponatremic during hospitalization.    Discharge Diagnoses/Problem List:  * Small bowel obstruction ([place]) Advanced to soft diet yesterday. Abdomen soft and nontender, bowel sounds appreciated on exam.  Continues to have stable vitals and reassuring abdominal exam.  Continue co-management with General Surgery. Pain well-controlled and has not required any as needed morphine in the past 24 hours. - Gen surg following, appreciate care - D5NS at 85 mL/hr - IV morphine PRN for pain  - Replete electrolytes as needed  Abnormal CT scan of lung Respiratory status stable on room air. -Follow-up outpatient CXR   Disposition: Home  Discharge Condition: Stable  Discharge Exam:  General: appears improved, in NAD  Cardiovascular: RRR. No m/r/g. Respiratory: CTAB. Normal WOB on RA. Abdomen: Soft, non-tender, non-distended. Bowel sounds present.   Significant Procedures: None  Significant Labs and Imaging:  No results for input(s): "WBC", "HGB", "HCT", "PLT" in the last 48 hours. Recent Labs  Lab 01/31/23 0346 02/01/23 0328  NA 139 139  K 3.6 3.5  CL 108 106  CO2 24 25  GLUCOSE 96 110*  BUN <5* <5*  CREATININE 0.41* 0.53  CALCIUM 8.3* 7.9*  MG 2.0  --   PHOS 2.4* 3.0   Results/Tests Pending at Time of Discharge: None  Discharge Medications:  Allergies as of 02/01/2023       Reactions   Beef-derived Products    Pork-derived Products         Medication List     STOP taking these medications    ciclopirox 8 % solution Commonly known as: PENLAC   famotidine 20 MG tablet Commonly known as: PEPCID       TAKE these medications    Zoster Vaccine Adjuvanted injection Commonly known as: SHINGRIX Please administer 2nd dose 2  months after the initial        Discharge Instructions: Please refer to Patient Instructions section of EMR for full details.  Patient was counseled important signs and symptoms that should prompt return to medical care, changes in medications, dietary instructions, activity  restrictions, and follow up appointments.   Follow-Up Appointments:  Follow-up Information     Kinnie Feil, MD. Go on 02/05/2023.   Specialty: Family Medicine Why: 10:30am please arrive 94mns early Contact information: [phone]                 CRolanda Lundborg MD 02/01/2023, 11:36 AM PGY-1, [place]

## 2023-02-05 ENCOUNTER — Encounter: Payer: Self-pay | Admitting: Family Medicine

## 2023-02-05 ENCOUNTER — Ambulatory Visit: Payer: BC Managed Care – PPO | Admitting: Family Medicine

## 2023-02-05 VITALS — BP 135/72 | HR 79 | Ht 61.0 in | Wt 148.2 lb

## 2023-02-05 DIAGNOSIS — Z8719 Personal history of other diseases of the digestive system: Secondary | ICD-10-CM | POA: Diagnosis not present

## 2023-02-05 DIAGNOSIS — K56609 Unspecified intestinal obstruction, unspecified as to partial versus complete obstruction: Secondary | ICD-10-CM

## 2023-02-05 DIAGNOSIS — E878 Other disorders of electrolyte and fluid balance, not elsewhere classified: Secondary | ICD-10-CM

## 2023-02-05 DIAGNOSIS — R918 Other nonspecific abnormal finding of lung field: Secondary | ICD-10-CM | POA: Diagnosis not present

## 2023-02-05 NOTE — Assessment & Plan Note (Signed)
Hospital record, lab report and CT scan reviewed. Thankfully, she did not need surgical treatment and is doing well post discharge. Electrolytes were abnormal during hospitalization. We will recheck Cmet, Phos and Magnesium today. I will contact her soon with test results.  She is up to date with colon cancer screen with cologuard. I discussed colonoscopy and she will consider this next year.

## 2023-02-05 NOTE — Patient Instructions (Addendum)
Food Rich in Phosphorus Chicken and [place] are both excellent sources of phosphorus, especially the light meat. Each 3-oz (85-g) serving provides nearly 16% of the DV. Roasting preserves more of the phosphorus than boiling.  Core Strength Exercises Ask your health care provider which exercises are safe for you. Do exercises exactly as told by your health care provider and adjust them as directed. It is normal to feel mild stretching, pulling, tightness, or discomfort as you do these exercises. Stop right away if you feel sudden pain or your pain gets worse. Do not begin these exercises until told by your health care provider. Benefits of core strength exercises Core exercises help to build strength in the muscles between your ribs and your hips (abdominal muscles). These muscles help to support your body and keep your spine stable. It is important to maintain strength in your core to prevent injury and pain. Some activities, such as yoga and Pilates, can help to strengthen core muscles. You can also strengthen core muscles with exercises at home. It is important to talk to your health care provider before you start a new exercise routine. Core strength exercises can: Reduce back pain. Help to rebuild strength after a back or spine injury. Help to prevent injury during physical activity, especially injuries to the back, hips, and knees. How to do core strength exercises Repeat these exercises 10-15 times, or until you are tired. Stop if you feel any pain while doing these exercises. Contact your health care provider if your pain continues or gets worse while doing or after doing core strength exercises. For strength exercises that are done on the floor, use a padded yoga mat or an exercise mat. Bridging  Lie on your back on a firm surface with your knees bent and your feet flat on the floor. Raise your hips so that your knees, hips, and shoulders together form a straight line. Do not excessively  arch your back. Keep your abdominal muscles tight. Hold this position for 3-5 seconds. Slowly lower your hips to the starting position. Let your muscles relax completely between repetitions. Single-leg bridge  Lie on your back on a firm surface with your knees bent and your feet flat on the floor. Raise your hips so that your knees, hips, and shoulders together form a straight line. Do not excessively arch your back. Keep your abdominal muscles tight. Lift one foot off the floor while maintaining alignment in your knees, hips, and shoulders. Then, completely straighten the lifted leg. Hold this position for 3-5 seconds. Put the straight leg back down in the bent position. Slowly lower your hips to the starting position. Repeat these steps using your other leg. Side bridge  Lie on your side with your knees bent. Prop yourself up on the elbow that is near the floor. Using your abdominal muscles and the elbow you are propped up on, raise your body off the floor. Raise your hip so that your shoulder, hip, and foot together form a straight line. Hold this position for 10 seconds. Keep your head and neck raised and away from your shoulder (in their normal, neutral position). Keep your abdominal muscles tight. Slowly lower your hip to the starting position. Repeat and try to hold this position longer, working your way up to 30 seconds. Abdominal crunch  Lie on your back on a firm surface. Bend your knees and keep your feet flat on the floor. Cross your arms over your chest. Without bending your neck, tip your chin slightly  toward your chest. Tighten your abdominal muscles as you lift your chest just high enough to lift your shoulder blades off the floor. Do not hold your breath. You can do this with short lifts or long lifts. Slowly return to the starting position. Bird dog  Get on your hands and knees, with your legs shoulder-width apart and your arms under your shoulders. Keep your back  straight. Tighten your abdominal muscles. Raise one of your legs off the floor and straighten it. Try to keep it parallel to the floor. Slowly lower your leg to the starting position. Raise one of your arms off the floor and straighten it. Try to keep it parallel to the floor. Slowly lower your arm to the starting position. Repeat with the other arm and leg. If possible, try raising a leg and an arm at the same time, on opposite sides of the body. For example, raise your left hand and your right leg. Rosilyn Mings on your belly. Prop up your body onto your forearms and your feet, keeping your legs straight. Your body should make a straight line between your shoulders and feet. Hold this position for 10 seconds while keeping your abdominal muscles tight. Lower your body to the starting position. Repeat and try to hold this position longer, working your way up to 30 seconds. Cross-core strengthening  Stand with your feet shoulder-width apart. Hold a ball out in front of you. Keep your arms straight. Tighten your abdominal muscles and slowly rotate at your waist from side to side. Keep your feet flat. Once you are comfortable, try repeating this exercise with a heavier ball. Top core strengthening  Stand about 18 inches (46 cm) out from a wall, with your back to the wall. Keep your feet flat and shoulder-width apart. Tighten your abdominal muscles. Bend your hips and knees. Slowly reach between your legs to touch the wall behind you. Slowly stand back up. Raise your arms over your head and reach behind you. Return to the starting position. General tips Do not do any exercises that cause pain. If you have pain while exercising, talk to your health care provider. Always stretch before and after doing these exercises. This can help prevent injury. Maintain a healthy weight. Ask your health care provider what weight is healthy for you. Contact a health care provider if: You have back pain  that gets worse or does not go away. You feel pain while doing core strength exercises. Get help right away if: You have severe pain that does not get better with medicine. Summary Core exercises help to build strength in the muscles between your ribs and your waist. Core muscles help to support your body and keep your spine stable. Some activities, such as yoga and Pilates, can help to strengthen core muscles. Core strength exercises can help back pain and can prevent injury. Stop if you feel any pain while doing core strength exercises. This information is not intended to replace advice given to you by your health care provider. Make sure you discuss any questions you have with your health care provider. Document Revised: 05/06/2021 Document Reviewed: 08/06/2020 Elsevier Patient Education  [place].

## 2023-02-05 NOTE — Progress Notes (Signed)
    SUBJECTIVE:   CHIEF COMPLAINT / HPI:   Hospital F/U - SBO and possible PNA The patient is doing well and is slowly advancing her diet. She eats broth and oatmeal and mostly a liquid and soft diet. She denies abdominal pain, no N/V. She moves her bowel regularly, and her last normal BM was yesterday. She admits that she is still feeling a bit tired but has been better since hospitalization.  She denies cough or SOB and has no chest pain. He was treated with antibiotics for presumed PNA in the hospital. Repeat chest x-ray in 4-6 weeks is recommended.   PERTINENT  PMH / PSH: PMHx reviewed  OBJECTIVE:   BP 135/72   Pulse 79   Ht 5\' 1"  (1.549 m)   Wt 148 lb 4 oz (67.2 kg)   SpO2 100%   BMI 28.01 kg/m   Physical Exam Vitals and nursing note reviewed.  Cardiovascular:     Rate and Rhythm: Normal rate and regular rhythm.     Heart sounds: Normal heart sounds. No murmur heard. Pulmonary:     Effort: Pulmonary effort is normal. No respiratory distress.     Breath sounds: Normal breath sounds. No wheezing.  Abdominal:     General: Abdomen is flat. Bowel sounds are normal. There is no distension.     Palpations: Abdomen is soft. There is no mass.     Tenderness: There is no abdominal tenderness.  Musculoskeletal:     Right lower leg: No edema.     Left lower leg: No edema.      ASSESSMENT/PLAN:   SBO (small bowel obstruction) [facility]) Hospital record, lab report and CT scan reviewed. Thankfully, she did not need surgical treatment and is doing well post discharge. Electrolytes were abnormal during hospitalization. We will recheck Cmet, Phos and Magnesium today. I will contact her soon with test results.  She is up to date with colon cancer screen with cologuard. I discussed colonoscopy and she will consider this next year.   Abnormal CT scan of lung Treated empirically with antibiotic for PNA. She is currently asymptomatic. Plan to repeat xray in 4-6 weeks or symptoms if  she has respiratory symptoms. She agreed with the plan.      Andrena Mews, MD [place]

## 2023-02-05 NOTE — Assessment & Plan Note (Signed)
Treated empirically with antibiotic for PNA. She is currently asymptomatic. Plan to repeat xray in 4-6 weeks or symptoms if she has respiratory symptoms. She agreed with the plan.

## 2023-02-06 LAB — CMP14+EGFR
ALT: 174 IU/L — ABNORMAL HIGH (ref 0–32)
AST: 123 IU/L — ABNORMAL HIGH (ref 0–40)
Albumin/Globulin Ratio: 1.4 (ref 1.2–2.2)
Albumin: 3.8 g/dL (ref 3.8–4.9)
Alkaline Phosphatase: 170 IU/L — ABNORMAL HIGH (ref 44–121)
BUN/Creatinine Ratio: 18 (ref 12–28)
BUN: 9 mg/dL (ref 8–27)
Bilirubin Total: 0.3 mg/dL (ref 0.0–1.2)
CO2: 29 mmol/L (ref 20–29)
Calcium: 9.4 mg/dL (ref 8.7–10.3)
Chloride: 99 mmol/L (ref 96–106)
Creatinine, Ser: 0.49 mg/dL — ABNORMAL LOW (ref 0.57–1.00)
Globulin, Total: 2.7 g/dL (ref 1.5–4.5)
Glucose: 82 mg/dL (ref 70–99)
Potassium: 4.7 mmol/L (ref 3.5–5.2)
Sodium: 140 mmol/L (ref 134–144)
Total Protein: 6.5 g/dL (ref 6.0–8.5)
eGFR: 108 mL/min/{1.73_m2} (ref >59)

## 2023-02-06 LAB — PHOSPHORUS: Phosphorus: 4 mg/dL (ref 3.0–4.3)

## 2023-02-06 LAB — MAGNESIUM: Magnesium: 2.3 mg/dL (ref 1.6–2.3)

## 2023-02-08 ENCOUNTER — Telehealth: Payer: Self-pay | Admitting: Family Medicine

## 2023-02-08 NOTE — Telephone Encounter (Signed)
Liver enzymes elevated. Plan to retest in 4 weeks at her next visit. She denies any GI symptoms and does not take any medications or herbal supplements. No additional questions.

## 2023-03-09 ENCOUNTER — Ambulatory Visit (HOSPITAL_COMMUNITY)
Admission: RE | Admit: 2023-03-09 | Discharge: 2023-03-09 | Disposition: A | Discharge status: Home / Self Care | Payer: BC Managed Care – PPO | Source: Ambulatory Visit | Attending: Family Medicine | Admitting: Family Medicine

## 2023-03-09 ENCOUNTER — Encounter: Payer: Self-pay | Admitting: Family Medicine

## 2023-03-09 ENCOUNTER — Ambulatory Visit: Payer: BC Managed Care – PPO | Admitting: Family Medicine

## 2023-03-09 VITALS — BP 113/54 | HR 65 | Ht 61.0 in | Wt 159.1 lb

## 2023-03-09 DIAGNOSIS — I959 Hypotension, unspecified: Secondary | ICD-10-CM

## 2023-03-09 DIAGNOSIS — J181 Lobar pneumonia, unspecified organism: Secondary | ICD-10-CM

## 2023-03-09 DIAGNOSIS — Z8719 Personal history of other diseases of the digestive system: Secondary | ICD-10-CM | POA: Insufficient documentation

## 2023-03-09 DIAGNOSIS — R748 Abnormal levels of other serum enzymes: Secondary | ICD-10-CM | POA: Insufficient documentation

## 2023-03-09 DIAGNOSIS — R918 Other nonspecific abnormal finding of lung field: Secondary | ICD-10-CM

## 2023-03-09 NOTE — Patient Instructions (Signed)

## 2023-03-09 NOTE — Assessment & Plan Note (Signed)
Consolidation on CT during hospitalization. Was treated with A/B and repeat xray recommended. Xray ordered.

## 2023-03-09 NOTE — Assessment & Plan Note (Signed)
Etiology unclear. Asymptomatic. Repeat Cmet and Hepatitis panel today. I will contact her soon with her results.

## 2023-03-09 NOTE — Progress Notes (Signed)
    SUBJECTIVE:   CHIEF COMPLAINT / HPI:   Elevated Liver Enzymes: Here for lab f/u. She denies any GI symptoms. Good bowel movements. Doing well otherwise.  Lung consolidation: She denies any respiratory symptoms. She is here for xray f/u.  Hypotension:  No prior hx of hypotension. She denies any weakness or dizziness. She has gradually built up her appetite since her recent hospitalization for SBO. She also gave up coffee, previously taking 3 cups per day.  PERTINENT  PMH / PSH: PMHx reviewed  OBJECTIVE:   Vitals:   03/09/23 0821 03/09/23 0841  BP: (!) 98/48 (!) 113/54  Pulse: 65   SpO2: 100%   Weight: 159 lb 2 oz (72.2 kg)   Height:  (1.549 m)     Physical Exam Vitals and nursing note reviewed.  Constitutional:      Appearance: Normal appearance.  Cardiovascular:     Rate and Rhythm: Normal rate and regular rhythm.     Heart sounds: Normal heart sounds. No murmur heard. Pulmonary:     Effort: Pulmonary effort is normal. No respiratory distress.     Breath sounds: Normal breath sounds. No wheezing.  Abdominal:     General: Abdomen is flat. Bowel sounds are normal. There is no distension.     Palpations: Abdomen is soft. There is no mass.     Tenderness: There is no abdominal tenderness.  Musculoskeletal:     Right lower leg: No edema.     Left lower leg: No edema.  Neurological:     Mental Status: She is alert.      ASSESSMENT/PLAN:   Elevated liver enzymes Etiology unclear. Asymptomatic. Repeat Cmet and Hepatitis panel today. I will contact her soon with her results.  Abnormal CT scan of lung Consolidation on CT during hospitalization. Was treated with A/B and repeat xray recommended. Xray ordered.  Hypotension Asymptomatic. Not on antihypotensive agents. Repeat BP improved. Continue home monitoring. Red flag symptoms discussed.     Janit Pagan, MD [facility]

## 2023-03-09 NOTE — Assessment & Plan Note (Signed)
Asymptomatic. Not on antihypotensive agents. Repeat BP improved. Continue home monitoring. Red flag symptoms discussed.

## 2023-03-10 LAB — ACUTE HEP PANEL AND HEP B SURFACE AB
Hep A IgM: NEGATIVE
Hep B C IgM: NEGATIVE
Hep C Virus Ab: NONREACTIVE
Hepatitis B Surf Ab Quant: 18.5 m[IU]/mL (ref >9.9)
Hepatitis B Surface Ag: NEGATIVE

## 2023-03-10 LAB — CMP14+EGFR
ALT: 21 IU/L (ref 0–32)
AST: 21 IU/L (ref 0–40)
Albumin/Globulin Ratio: 1.5 (ref 1.2–2.2)
Albumin: 3.9 g/dL (ref 3.8–4.9)
Alkaline Phosphatase: 90 IU/L (ref 44–121)
BUN/Creatinine Ratio: 27 (ref 12–28)
BUN: 17 mg/dL (ref 8–27)
Bilirubin Total: 0.8 mg/dL (ref 0.0–1.2)
CO2: 25 mmol/L (ref 20–29)
Calcium: 9.5 mg/dL (ref 8.7–10.3)
Chloride: 103 mmol/L (ref 96–106)
Creatinine, Ser: 0.64 mg/dL (ref 0.57–1.00)
Globulin, Total: 2.6 g/dL (ref 1.5–4.5)
Glucose: 92 mg/dL (ref 70–99)
Potassium: 4.4 mmol/L (ref 3.5–5.2)
Sodium: 140 mmol/L (ref 134–144)
Total Protein: 6.5 g/dL (ref 6.0–8.5)
eGFR: 101 mL/min/{1.73_m2} (ref >59)

## 2024-01-31 ENCOUNTER — Encounter: Payer: Self-pay | Admitting: Family Medicine

## 2024-02-04 ENCOUNTER — Ambulatory Visit (INDEPENDENT_AMBULATORY_CARE_PROVIDER_SITE_OTHER): Payer: Self-pay | Admitting: Family Medicine

## 2024-02-04 ENCOUNTER — Encounter: Payer: Self-pay | Admitting: Family Medicine

## 2024-02-04 VITALS — BP 108/64 | HR 60 | Ht 64.29 in | Wt 153.4 lb

## 2024-02-04 DIAGNOSIS — Z23 Encounter for immunization: Secondary | ICD-10-CM

## 2024-02-04 DIAGNOSIS — Z0000 Encounter for general adult medical examination without abnormal findings: Secondary | ICD-10-CM | POA: Diagnosis not present

## 2024-02-04 DIAGNOSIS — E785 Hyperlipidemia, unspecified: Secondary | ICD-10-CM | POA: Diagnosis not present

## 2024-02-04 DIAGNOSIS — I959 Hypotension, unspecified: Secondary | ICD-10-CM

## 2024-02-04 DIAGNOSIS — N6310 Unspecified lump in the right breast, unspecified quadrant: Secondary | ICD-10-CM | POA: Diagnosis not present

## 2024-02-04 NOTE — Assessment & Plan Note (Signed)
 BP and HR low normal range She is asymptomatic Monitor closely for now

## 2024-02-04 NOTE — Patient Instructions (Signed)
 Managing Stress, Adult Feeling a certain amount of stress is normal. Stress helps our body and mind get ready to deal with the demands of life. Stress hormones can motivate you to do well at work and meet your responsibilities. But severe or long-term (chronic) stress can affect your mental and physical health. Chronic stress puts you at higher risk for: Anxiety and depression. Other health problems such as digestive problems, muscle aches, heart disease, high blood pressure, and stroke. What are the causes? Common causes of stress include: Demands from work, such as deadlines, feeling overworked, or having long hours. Pressures at home, such as money issues, disagreements with a spouse, or parenting issues. Pressures from major life changes, such as divorce, moving, loss of a loved one, or chronic illness. You may be at higher risk for stress-related problems if you: Do not get enough sleep. Are in poor health. Do not have emotional support. Have a mental health disorder such as anxiety or depression. How to recognize stress Stress can make you: Have trouble sleeping. Feel sad, anxious, irritable, or overwhelmed. Lose your appetite. Overeat or want to eat unhealthy foods. Want to use drugs or alcohol. Stress can also cause physical symptoms, such as: Sore, tense muscles, especially in the shoulders and neck. Headaches. Trouble breathing. A faster heart rate. Stomach pain, nausea, or vomiting. Diarrhea or constipation. Trouble concentrating. Follow these instructions at home: Eating and drinking Eat a healthy diet. This includes: Eating foods that are high in fiber, such as beans, whole grains, and fresh fruits and vegetables. Limiting foods that are high in fat and processed sugars, such as fried or sweet foods. Do not skip meals or overeat. Drink enough fluid to keep your urine pale yellow. Alcohol use Do not drink alcohol if: Your health care provider tells you not to  drink. You are pregnant, may be pregnant, or are planning to become pregnant. Drinking alcohol is a way some people try to ease their stress. This can be dangerous, so if you drink alcohol: Limit how much you have to: 0-1 drink a day for women. 0-2 drinks a day for men. Know how much alcohol is in your drink. In the [place] one drink equals one 12 oz bottle of beer (355 mL), one 5 oz glass of wine (148 mL), or one 1 oz glass of hard liquor (44 mL). Activity  Include 30 minutes of exercise in your daily schedule. Exercise is a good stress reducer. Include time in your day for an activity that you find relaxing. Try taking a walk, going on a bike ride, reading a book, or listening to music. Schedule your time in a way that lowers stress, and keep a regular schedule. Focus on doing what is most important to get done. Lifestyle Identify the source of your stress and your reaction to it. See a therapist who can help you change unhelpful reactions. When there are stressful events: Talk about them with family, friends, or coworkers. Try to think realistically about stressful events and not ignore them or overreact. Try to find the positives in a stressful situation and not focus on the negatives. Cut back on responsibilities at work and home, if possible. Ask for help from friends or family members if you need it. Find ways to manage stress, such as: Mindfulness, meditation, or deep breathing. Yoga or tai chi. Progressive muscle relaxation. Spending time in nature. Doing art, playing music, or reading. Making time for fun activities. Spending time with family and friends. Get support  from family, friends, or spiritual resources. General instructions Get enough sleep. Try to go to sleep and get up at about the same time every day. Take over-the-counter and prescription medicines only as told by your health care provider. Do not use any products that contain nicotine or tobacco. These products  include cigarettes, chewing tobacco, and vaping devices, such as e-cigarettes. If you need help quitting, ask your health care provider. Do not use drugs or smoke to deal with stress. Keep all follow-up visits. This is important. Where to find support Talk with your health care provider about stress management or finding a support group. Find a therapist to work with you on your stress management techniques. Where to find more information [employer] on Mental Illness: www.nami.org American Psychological Association: DiceTour[employer].ca Contact a health care provider if: Your stress symptoms get worse. You are unable to manage your stress at home. You are struggling to stop using drugs or alcohol. Get help right away if: You may be a danger to yourself or others. You have any thoughts of death or suicide. Get help right awayif you feel like you may hurt yourself or others, or have thoughts about taking your own life. Go to your nearest emergency room or: Call 911. Call the National Suicide Prevention Lifeline at (304)678-3299 or 988 in the [place] This is open 24 hours a day. If you're a Veteran: Call 988 and press 1. This is open 24 hours a day. Text the [employer] at 226-132-3352. Summary Feeling a certain amount of stress is normal, but severe or long-term (chronic) stress can affect your mental and physical health. Chronic stress can put you at higher risk for anxiety, depression, and other health problems such as digestive problems, muscle aches, heart disease, high blood pressure, and stroke. You may be at higher risk for stress-related problems if you do not get enough sleep, are in poor health, lack emotional support, or have a mental health disorder such as anxiety or depression. Identify the source of your stress and your reaction to it. Try talking about stressful events with family, friends, or coworkers, finding a coping method, or getting support from spiritual resources. If  you need more help, talk with your health care provider about finding a support group or a mental health therapist. This information is not intended to replace advice given to you by your health care provider. Make sure you discuss any questions you have with your health care provider. Document Revised: 06/24/2023 Document Reviewed: 06/03/2021 Elsevier Patient Education  2024 [employer].

## 2024-02-04 NOTE — Progress Notes (Signed)
 SUBJECTIVE:   Chief compliant/HPI: annual examination  Madison Booker is a 63 y.o. who presents today for an annual exam.   Review of systems form notable for mild insomnia.   Updated history tabs and problem list: yes.   OBJECTIVE:   BP 108/64   Pulse (!) 57   Ht 5' 4.29" (1.633 m)   Wt 153 lb 6 oz (69.6 kg)   SpO2 (!) 60%   BMI 26.09 kg/m   Physical Exam Vitals and nursing note reviewed.  Constitutional:      General: She is not in acute distress.    Appearance: She is not ill-appearing.  HENT:     Right Ear: Tympanic membrane and ear canal normal.     Left Ear: Tympanic membrane and ear canal normal.     Mouth/Throat:     Pharynx: No oropharyngeal exudate or posterior oropharyngeal erythema.  Eyes:     Extraocular Movements: Extraocular movements intact.     Conjunctiva/sclera: Conjunctivae normal.     Pupils: Pupils are equal, round, and reactive to light.  Cardiovascular:     Rate and Rhythm: Normal rate and regular rhythm.     Heart sounds: Normal heart sounds. No murmur heard. Pulmonary:     Effort: Pulmonary effort is normal. No respiratory distress.     Breath sounds: Normal breath sounds. No wheezing.  Abdominal:     General: Abdomen is flat. There is no distension.     Palpations: Abdomen is soft. There is no mass    Tenderness: There is no abdominal tenderness.  Musculoskeletal:     Cervical back: Neck supple.     Right lower leg: No edema.     Left lower leg: No edema.  Neurological:     General: No focal deficit present.     Mental Status: She is oriented to person, place, and time.     Cranial Nerves: No cranial nerve deficit.     Sensory: No sensory deficit.  Psychiatric:        Mood and Affect: Mood normal.        Behavior: Behavior normal.      ASSESSMENT/PLAN:   Hypotension BP and HR low normal range She is asymptomatic Monitor closely for now  Breast mass, right She had abnormal MRI of her breast that required repeat MRI  which she did not follow up with. MRI breast ordered She has information to call for her appointment    Annual Examination  See AVS for age appropriate recommendations.   PHQ score 2, reviewed and discussed. Blood pressure reviewed and at goal: Yes.  Asked about intimate partner violence and patient reports N/A.    Considered the following items based upon USPSTF recommendations: HIV testing: Up to Date, not at risk Hepatitis C: Up to Date, not at risk Hepatitis B: Not at risk Syphilis if at high risk: Up to Date, not at risk GC/CT not at high risk and not ordered. Lipid panel (nonfasting or fasting) discussed based upon AHA recommendations and ordered.  Consider repeat every 4-6 years.  Reviewed risk factors for latent tuberculosis and not indicated  Discussed family history, BRCA testing not indicated. Tool used to risk stratify was Pedigree Assessment tool N/A  She had abnormal MRI of her breast that required repeat MRI which she did not follow up with. MRI breast ordered She has information to call for her appointment  Cervical cancer screening: She had abdominal hysterectomy with cervix removed in 1993 and  had not needed to do PAP since then. No other concern today. Immunizations Reviewed   Follow up in 1   year or sooner if indicated.   Janit Pagan, MD [facility]

## 2024-02-04 NOTE — Assessment & Plan Note (Signed)
 She had abnormal MRI of her breast that required repeat MRI which she did not follow up with. MRI breast ordered She has information to call for her appointment

## 2024-02-05 ENCOUNTER — Encounter: Payer: Self-pay | Admitting: Family Medicine

## 2024-02-05 LAB — LIPID PANEL
Chol/HDL Ratio: 2.5 ratio (ref 0.0–4.4)
Cholesterol, Total: 190 mg/dL (ref 100–199)
HDL: 76 mg/dL (ref >39)
LDL Chol Calc (NIH): 104 mg/dL — ABNORMAL HIGH (ref 0–99)
Triglycerides: 51 mg/dL (ref 0–149)
VLDL Cholesterol Cal: 10 mg/dL (ref 5–40)

## 2024-02-05 LAB — CMP14+EGFR
ALT: 12 IU/L (ref 0–32)
AST: 16 IU/L (ref 0–40)
Albumin: 4.1 g/dL (ref 3.9–4.9)
Alkaline Phosphatase: 78 IU/L (ref 44–121)
BUN/Creatinine Ratio: 9 — ABNORMAL LOW (ref 12–28)
BUN: 6 mg/dL — ABNORMAL LOW (ref 8–27)
Bilirubin Total: 0.8 mg/dL (ref 0.0–1.2)
CO2: 25 mmol/L (ref 20–29)
Calcium: 9.2 mg/dL (ref 8.7–10.3)
Chloride: 99 mmol/L (ref 96–106)
Creatinine, Ser: 0.66 mg/dL (ref 0.57–1.00)
Globulin, Total: 2.6 g/dL (ref 1.5–4.5)
Glucose: 77 mg/dL (ref 70–99)
Potassium: 4.4 mmol/L (ref 3.5–5.2)
Sodium: 138 mmol/L (ref 134–144)
Total Protein: 6.7 g/dL (ref 6.0–8.5)
eGFR: 100 mL/min/{1.73_m2} (ref >59)

## 2024-02-10 ENCOUNTER — Telehealth: Payer: Self-pay

## 2024-02-10 NOTE — Addendum Note (Signed)
 Addended by: Janit Pagan T on: 02/10/2024 03:23 PM   Modules accepted: Orders

## 2024-02-10 NOTE — Telephone Encounter (Signed)
 Patient calls nurse line in regards to breast MRI.   She reports she called [facility] and they stated she would need a screening mammogram before they would perform MRI.   She reports this is very frustrating to her and a "waste of time."   I called [facility] and they report their policy is a screening mammogram within one year of MRI.  Patients last mammogram was in 2023.  Discussed with patient and she reports continued frustrations.   Advised will forward to PCP.

## 2024-02-10 NOTE — Telephone Encounter (Signed)
 Discussed with patient. She want's to see if [facility] will do her MRI breast without a mammogram. She is willing to pay out of pocket for this. Does not want to get mammogram done prior to breast MRI due to her dense breast tissue.  Blue team CMA, please help her schedule mammogram with [facility]. Thanks.

## 2024-02-10 NOTE — Progress Notes (Signed)
 Spoke with patient. Informed her that someone from [facility] has been trying to reach her. To make an appt for her MRM there. I gave patient the number to GI she stated that she will call them. Madison Booker, CMA

## 2024-02-17 ENCOUNTER — Telehealth: Payer: Self-pay

## 2024-02-17 NOTE — Telephone Encounter (Signed)
-----   Message from [facility] Jasmine December S sent at 02/15/2024  5:23 PM EDT ----- Please schedule at [facility]. No prior Berkley Harvey is needed [employer]

## 2024-02-17 NOTE — Telephone Encounter (Signed)
 Spoke with patient . Informed of MRI at [facility] April 4th at 8:00am. Patient understood. Aquilla Solian, CMA

## 2024-02-24 ENCOUNTER — Encounter: Payer: Self-pay | Admitting: Family Medicine

## 2024-02-24 NOTE — Addendum Note (Signed)
 Addended by: Janit Pagan T on: 02/24/2024 11:01 AM   Modules accepted: Orders

## 2024-02-25 ENCOUNTER — Other Ambulatory Visit: Payer: Self-pay | Admitting: Family Medicine

## 2024-02-25 ENCOUNTER — Encounter: Payer: Self-pay | Admitting: Family Medicine

## 2024-02-25 ENCOUNTER — Ambulatory Visit (HOSPITAL_COMMUNITY): Admission: RE | Admit: 2024-02-25 | Payer: Self-pay | Source: Ambulatory Visit

## 2024-02-25 DIAGNOSIS — Z1231 Encounter for screening mammogram for malignant neoplasm of breast: Secondary | ICD-10-CM

## 2024-03-01 ENCOUNTER — Ambulatory Visit
Admission: RE | Admit: 2024-03-01 | Discharge: 2024-03-01 | Disposition: A | Discharge status: Home / Self Care | Source: Ambulatory Visit | Attending: Family Medicine | Admitting: Family Medicine

## 2024-03-01 DIAGNOSIS — Z1231 Encounter for screening mammogram for malignant neoplasm of breast: Secondary | ICD-10-CM

## 2024-03-07 NOTE — Addendum Note (Signed)
 Addended by: Penni Bowman T on: 03/07/2024 04:49 AM   Modules accepted: Orders

## 2024-03-15 ENCOUNTER — Ambulatory Visit
Admission: RE | Admit: 2024-03-15 | Discharge: 2024-03-15 | Disposition: A | Discharge status: Home / Self Care | Source: Ambulatory Visit | Attending: Family Medicine | Admitting: Family Medicine

## 2024-03-15 ENCOUNTER — Encounter: Payer: Self-pay | Admitting: Family Medicine

## 2024-03-15 DIAGNOSIS — N6310 Unspecified lump in the right breast, unspecified quadrant: Secondary | ICD-10-CM

## 2024-03-15 MED ORDER — GADOPICLENOL 0.5 MMOL/ML IV SOLN
7.0000 mL | Freq: Once | INTRAVENOUS | Status: AC | PRN
  Administered 2024-03-15: 7 mL via INTRAVENOUS

## 2024-03-24 ENCOUNTER — Telehealth: Payer: Self-pay

## 2024-03-24 ENCOUNTER — Emergency Department (HOSPITAL_COMMUNITY)

## 2024-03-24 ENCOUNTER — Other Ambulatory Visit: Payer: Self-pay

## 2024-03-24 ENCOUNTER — Inpatient Hospital Stay (HOSPITAL_COMMUNITY)
Admission: EM | Admit: 2024-03-24 | Discharge: 2024-03-26 | DRG: 389 | Disposition: A | Discharge status: Home / Self Care | Attending: Family Medicine | Admitting: Family Medicine

## 2024-03-24 ENCOUNTER — Encounter (HOSPITAL_COMMUNITY): Payer: Self-pay | Admitting: General Surgery

## 2024-03-24 DIAGNOSIS — I959 Hypotension, unspecified: Secondary | ICD-10-CM | POA: Diagnosis present

## 2024-03-24 DIAGNOSIS — R188 Other ascites: Secondary | ICD-10-CM | POA: Diagnosis present

## 2024-03-24 DIAGNOSIS — Z789 Other specified health status: Secondary | ICD-10-CM

## 2024-03-24 DIAGNOSIS — Z90710 Acquired absence of both cervix and uterus: Secondary | ICD-10-CM | POA: Diagnosis not present

## 2024-03-24 DIAGNOSIS — Z833 Family history of diabetes mellitus: Secondary | ICD-10-CM

## 2024-03-24 DIAGNOSIS — Z83438 Family history of other disorder of lipoprotein metabolism and other lipidemia: Secondary | ICD-10-CM | POA: Diagnosis not present

## 2024-03-24 DIAGNOSIS — K56609 Unspecified intestinal obstruction, unspecified as to partial versus complete obstruction: Principal | ICD-10-CM | POA: Insufficient documentation

## 2024-03-24 DIAGNOSIS — Z841 Family history of disorders of kidney and ureter: Secondary | ICD-10-CM

## 2024-03-24 DIAGNOSIS — E785 Hyperlipidemia, unspecified: Secondary | ICD-10-CM | POA: Diagnosis present

## 2024-03-24 DIAGNOSIS — Z90721 Acquired absence of ovaries, unilateral: Secondary | ICD-10-CM

## 2024-03-24 DIAGNOSIS — K5651 Intestinal adhesions [bands], with partial obstruction: Principal | ICD-10-CM | POA: Diagnosis present

## 2024-03-24 DIAGNOSIS — Z803 Family history of malignant neoplasm of breast: Secondary | ICD-10-CM

## 2024-03-24 LAB — CBC
HCT: 44 % (ref 36.0–46.0)
Hemoglobin: 14.3 g/dL (ref 12.0–15.0)
MCH: 31.4 pg (ref 26.0–34.0)
MCHC: 32.5 g/dL (ref 30.0–36.0)
MCV: 96.7 fL (ref 80.0–100.0)
Platelets: 165 10*3/uL (ref 150–400)
RBC: 4.55 MIL/uL (ref 3.87–5.11)
RDW: 12.7 % (ref 11.5–15.5)
WBC: 6.9 10*3/uL (ref 4.0–10.5)
nRBC: 0 % (ref 0.0–0.2)

## 2024-03-24 LAB — COMPREHENSIVE METABOLIC PANEL WITH GFR
ALT: 15 U/L (ref 0–44)
AST: 23 U/L (ref 15–41)
Albumin: 3.7 g/dL (ref 3.5–5.0)
Alkaline Phosphatase: 70 U/L (ref 38–126)
Anion gap: 12 (ref 5–15)
BUN: 22 mg/dL (ref 8–23)
CO2: 25 mmol/L (ref 22–32)
Calcium: 9.3 mg/dL (ref 8.9–10.3)
Chloride: 100 mmol/L (ref 98–111)
Creatinine, Ser: 0.64 mg/dL (ref 0.44–1.00)
GFR, Estimated: >60 mL/min (ref >60)
Glucose, Bld: 120 mg/dL — ABNORMAL HIGH (ref 70–99)
Potassium: 3.9 mmol/L (ref 3.5–5.1)
Sodium: 137 mmol/L (ref 135–145)
Total Bilirubin: 1.3 mg/dL — ABNORMAL HIGH (ref 0.0–1.2)
Total Protein: 7 g/dL (ref 6.5–8.1)

## 2024-03-24 LAB — LIPASE, BLOOD: Lipase: 21 U/L (ref 11–51)

## 2024-03-24 MED ORDER — LACTATED RINGERS IV SOLN: INTRAVENOUS | Status: AC

## 2024-03-24 MED ORDER — ONDANSETRON 4 MG PO TBDP
4.0000 mg | ORAL_TABLET | Freq: Once | ORAL | Status: AC | PRN
  Administered 2024-03-24: 4 mg via ORAL
  Filled 2024-03-24: qty 1

## 2024-03-24 MED ORDER — ACETAMINOPHEN 650 MG RE SUPP: 650.0000 mg | RECTAL | Status: DC | PRN

## 2024-03-24 MED ORDER — IOHEXOL 350 MG/ML SOLN
75.0000 mL | Freq: Once | INTRAVENOUS | Status: AC | PRN
  Administered 2024-03-24: 75 mL via INTRAVENOUS

## 2024-03-24 MED ORDER — DIATRIZOATE MEGLUMINE & SODIUM 66-10 % PO SOLN
90.0000 mL | Freq: Once | ORAL | Status: AC
  Administered 2024-03-24: 90 mL via NASOGASTRIC
  Filled 2024-03-24: qty 90

## 2024-03-24 MED ORDER — ENOXAPARIN SODIUM 40 MG/0.4ML IJ SOSY
40.0000 mg | PREFILLED_SYRINGE | INTRAMUSCULAR | Status: DC
  Filled 2024-03-24 (×2): qty 0.4

## 2024-03-24 NOTE — Assessment & Plan Note (Addendum)
 Patient with multiple abdominal surgeries through her life, as well as prior episode of SBO in March 2024 which presented very similarly.  NG tube has been placed by general surgery for bowel decompression, hopefully patient will do well with this and be able to advance diet.  If she does not begin to improve she may require exploratory surgery.  At this time patient is very well-appearing on exam, VSS, no systemic concerns. - Admit to [facility]MTS, attending Dr. Andree Kayser - MedSurg, Vital signs per floor - NPO with NG tube in place for bowel decompression - PT/OT to treat - IVF LR 100 mL/hr - AM CBC, BMP, Mag - Keep K > 4 and Mg > 2 for bowel function  - Mobilize for bowel function -Tylenol  suppository PRN for pain

## 2024-03-24 NOTE — Hospital Course (Signed)
 Alazia H Moxon is a 63 y.o.female with a history of small bowel obstruction in 2024 who was admitted to the [facility] Teaching Service at [facility] for small bowel obstruction.   Her hospital course is detailed below:  Small bowel obstruction Prior to presentation to ED had 2 days of belly tightening, constipation, vomiting very similar to her last SBO.  On arrival CT imaging did show small bowel obstruction possibly secondary to adhesions.  Lab workup with CBC, CMP, lipase was grossly WNL.  General surgery was consulted and recommended admission for conservative management with possible exploratory surgery pending improvement.  NG tube was placed for gastric decompression. Pt was able to have NG tube removed, and then was able to advance to clear liquid diet prior to discharge.  PCP Follow-up Recommendations: Ensure improvement of PO intake. Advance back to regular diet as tolerated.

## 2024-03-24 NOTE — Telephone Encounter (Signed)
 Patient calls nurse line regarding concerns for possible bowel obstruction.   She reports history of obstruction back in 01/2023.  She reports that for the last few days she has been vomiting and unable to have bowel movement.   Reports that last BM was on Wednesday.   Reports that abdomen is now distended and warm to the touch.   Advised that due to patient's history and current symptoms, that she proceed to the ED for further evaluation.   Patient voices understanding.   Elsie Halo, RN

## 2024-03-24 NOTE — ED Notes (Signed)
Went to ct

## 2024-03-24 NOTE — Consult Note (Signed)
 Consult Note  Madison Booker 1960/12/25  [id number].    Requesting MD: Arnita Bible PA-C Chief Complaint/Reason for Consult: SBO  HPI:  63 y.o. female with medical history significant for SBO who presented to [facility] ED with abdominal pain, nausea, vomiting. Symptoms began as abdominal pain Wednesday morning which worsened over time and she developed nausea and vomiting. Last BM was Thursday after vomiting had started. No BM or flatus since then. She and husband noted increasing bloating and noted symptoms similar to SBO symptoms from prior episode approximately one year ago which resolved with conservative management.  Substance use: none Allergies: beef, pork Blood thinners: none Past Surgeries: cesarean section. Abdominal hysterectomy   ROS: Reviewed and as above  Family History  Problem Relation Age of Onset   Diabetes Sister        3 sisters   Diabetes gravidarum Sister        3 sisters   Hyperlipidemia Sister        3 sisters   Kidney disease Sister        1 sister   Breast cancer Maternal Aunt 79 - 79    Past Medical History:  Diagnosis Date   Abnormal CT scan of lung 01/25/2023   H/O abdominal hysterectomy 11/30/1991   SBO (small bowel obstruction) (HCC) 01/26/2023   Toenail fungus 10/19/2022    Past Surgical History:  Procedure Laterality Date   ABDOMINAL HYSTERECTOMY  1993   BREAST BIOPSY Right 04/02/2022   CESAREAN SECTION  1992    Social History:  reports that she has never smoked. She has never used smokeless tobacco. She reports that she does not drink alcohol and does not use drugs.  Allergies:  Allergies  Allergen Reactions   Beef-Derived Drug Products    Pork-Derived Products     (Not in a hospital admission)   Blood pressure (!) 117/56, pulse 62, temperature 98.4 F (36.9 C), temperature source Oral, resp. rate 19, height 5\' 5"  (1.651 m), weight 66.7 kg, SpO2 100%. Physical Exam: General: pleasant, WD, female who is laying  in bed in NAD HEENT: head is normocephalic, atraumatic.  Sclera are noninjected.  Pupils equal and round. EOMs intact.  Ears and nose without any masses or lesions.  Mouth is pink and moist Lungs: Respiratory effort nonlabored Abd: soft, mild distension, mild TTP in RLQ and R mid abdomen MSK: all 4 extremities are symmetrical with no cyanosis, clubbing, or edema. Skin: warm and dry with no masses, lesions, or rashes Neuro: Cranial nerves 2-12 grossly intact, sensation is normal throughout Psych: A&Ox3 with an appropriate affect.    Results for orders placed or performed during the hospital encounter of 03/24/24 (from the past 48 hours)  Lipase, blood     Status: None   Collection Time: 03/24/24 10:20 AM  Result Value Ref Range   Lipase 21 11 - 51 U/L    Comment: Performed at [facility], [address] [place], [place] [address]  Comprehensive metabolic panel     Status: Abnormal   Collection Time: 03/24/24 10:20 AM  Result Value Ref Range   Sodium 137 135 - 145 mmol/L   Potassium 3.9 3.5 - 5.1 mmol/L   Chloride 100 98 - 111 mmol/L   CO2 25 22 - 32 mmol/L   Glucose, Bld 120 (H) 70 - 99 mg/dL    Comment: Glucose reference range applies only to samples taken after fasting for at least 8 hours.   BUN  22 8 - 23 mg/dL   Creatinine, Ser 1.61 0.44 - 1.00 mg/dL   Calcium 9.3 8.9 - 09.6 mg/dL   Total Protein 7.0 6.5 - 8.1 g/dL   Albumin 3.7 3.5 - 5.0 g/dL   AST 23 15 - 41 U/L   ALT 15 0 - 44 U/L   Alkaline Phosphatase 70 38 - 126 U/L   Total Bilirubin 1.3 (H) 0.0 - 1.2 mg/dL   GFR, Estimated >04 >54 mL/min    Comment: (NOTE) Calculated using the CKD-EPI Creatinine Equation (2021)    Anion gap 12 5 - 15    Comment: Performed at [facility], [address] [place], [place] [address]  CBC     Status: None   Collection Time: 03/24/24 10:20 AM  Result Value Ref Range   WBC 6.9 4.0 - 10.5 K/uL   RBC 4.55 3.87 - 5.11 MIL/uL   Hemoglobin 14.3 12.0 - 15.0 g/dL   HCT 91.4  78.2 - 95.6 %   MCV 96.7 80.0 - 100.0 fL   MCH 31.4 26.0 - 34.0 pg   MCHC 32.5 30.0 - 36.0 g/dL   RDW 21.3 08.6 - 57.8 %   Platelets 165 150 - 400 K/uL   nRBC 0.0 0.0 - 0.2 %    Comment: Performed at [facility], [address] [place], [place] [address]   CT ABDOMEN PELVIS W CONTRAST Result Date: 03/24/2024 CLINICAL DATA:  Abdominal pain, acute, nonlocalized. EXAM: CT ABDOMEN AND PELVIS WITH CONTRAST TECHNIQUE: Multidetector CT imaging of the abdomen and pelvis was performed using the standard protocol following bolus administration of intravenous contrast. RADIATION DOSE REDUCTION: This exam was performed according to the departmental dose-optimization program which includes automated exposure control, adjustment of the mA and/or kV according to patient size and/or use of iterative reconstruction technique. CONTRAST:  75mL OMNIPAQUE  IOHEXOL  350 MG/ML SOLN COMPARISON:  CT scan abdomen and pelvis from 01/25/2023. FINDINGS: Lower chest: There are patchy atelectatic changes in the visualized lung bases. No overt consolidation. No pleural effusion. The heart is normal in size. No pericardial effusion. Hepatobiliary: The liver is normal in size. Non-cirrhotic configuration. No suspicious mass. No intrahepatic or extrahepatic bile duct dilation. Small volume dependent 5-7 mm sized calcified gallstones noted without imaging signs of acute cholecystitis. Normal gallbladder wall thickness. No pericholecystic inflammatory changes. Pancreas: Unremarkable. No pancreatic ductal dilatation or surrounding inflammatory changes. Spleen: Within normal limits. No focal lesion. Adrenals/Urinary Tract: Adrenal glands are unremarkable. No suspicious renal mass. No hydronephrosis. No renal or ureteric calculi. Unremarkable urinary bladder. Stomach/Bowel: There are multiple dilated loops of distal jejunum and proximal ileum up to a point of narrowing in the anterior right lower quadrant (series 5, image 53 and series 6,  image 84). Distal to this point remaining distal small bowel loops and colon are nondilated. There is no discrete mass or focal abdominal wall thickening noted. No perienteric fat stranding or prominence of vasa recta. Findings are likely secondary to adhesion in the right lower quadrant, causing small bowel obstruction. No pneumatosis, pneumoperitoneum or portal venous gas. No associated walled-off abscess or loculated collection. Note is made of small dependent ascites in the pelvis, likely reactive. Portion of appendix is seen in the right lower quadrant (series 6, image 68), without evidence of appendicitis. Vascular/Lymphatic: No abdominal or pelvic lymphadenopathy, by size criteria. No aneurysmal dilation of the major abdominal arteries. Reproductive: The uterus is surgically absent. No large adnexal mass. Other: The visualized soft tissues and abdominal  wall are unremarkable. Musculoskeletal: No suspicious osseous lesions. There are mild multilevel degenerative changes in the visualized spine. IMPRESSION: 1. Findings compatible with small bowel obstruction with transition point in the right lower quadrant, most likely secondary to adhesion. No pneumatosis, pneumoperitoneum or portal venous gas. No walled-off abscess or loculated collection. Small dependent pelvic ascites, likely reactive. 2. Multiple other nonacute observations, as described above. Electronically Signed   By: Beula Brunswick M.D.   On: 03/24/2024 14:14      Assessment/Plan SBO H/o C-section, hysterectomy, oophorectomy.  - CT w/ Findings compatible with small bowel obstruction with transition point in the right lower quadrant, most likely secondary to adhesion. No pneumatosis, pneumoperitoneum or portal venous gas. No walled-off abscess or loculated collection. Small dependent pelvic ascites, likely reactive. - No current indication for emergency surgery - AF, VSS (mild hypotension), WBC nml - NGT in place for decompression and keep  NPO - Start SBO protocol - Keep K > 4 and Mg > 2 for bowel function - Mobilize for bowel function - Hopefully patient will improve with conservative management. If patient fails to improve with conservative management, she may require exploratory surgery during admission - Agree with medical admission. We will follow with you.    FEN: NGT LIWS, IVF per primary ID: none indicated VTE: okay for chemical prophylaxis from surgical standpoint - pork allergy noted in chart discussed with patient and she does not have true pork allergy but prefers not to eat it and is willing to take lovenox  or subcutaneous heparin  I reviewed ED provider notes, last 24 h vitals and pain scores, last 48 h intake and output, last 24 h labs and trends, and last 24 h imaging results.   Elwin Hammond, [facility] 03/24/2024, 3:26 PM Please see Amion for pager number during day hours 7:00am-4:30pm

## 2024-03-24 NOTE — ED Triage Notes (Signed)
 Patient with hx of SBO here for eval of constipation and vomiting. Last BM Wednesday and now endorses abdominal pain and distension and is unable to tolerate PO intake.

## 2024-03-24 NOTE — Plan of Care (Signed)
 FMTS Interim Progress Note  S: Reports passing gas around 7:50 pm. No current pain or other concerns.   O: BP (!) 132/54 (BP Location: Right Arm)   Pulse 66   Temp 98.2 F (36.8 C) (Oral)   Resp 18   Ht 5\' 5"  (1.651 m)   Wt 66.7 kg   SpO2 97%   BMI 24.46 kg/m    General: No acute distress. Resting comfortably in room. CV: Normal S1/S2. No extra heart sounds. Warm and well-perfused. Pulm: Breathing comfortably on room air. CTAB anteriorly. No increased WOB. Abd: NG tube in place. Decreased BS. Mild distention. Tender to palpation of RLQ. No rebound or guarding.  Skin:  Warm, dry. Cap refill <2 s.  Psych: Pleasant and appropriate.    A/P: SBO NG tube in for decompression per surgery. Now passing gas. Appears well-hydrated on exam.  - Appreciate ongoing surgery recs - Cont mIVF LR  - Cont rectal Tylenol  prn  - AM CBC, BMP, Mg  Continue treatment plan as otherwise indicated in FMTS H&P.   Carey Chapman, MD 03/24/2024, 7:14 PM PGY-1, [facility] Family Medicine Service pager [phone]

## 2024-03-24 NOTE — ED Provider Notes (Cosign Needed Addendum)
 [place] [facility]ERGENC[facility] DEPARTMENT AT [facility] Provider Note   CSN: [id number] Arrival date & time: 03/24/24  1003     History  Chief Complaint  Patient presents with   Constipation   Abdominal Pain    Madison Booker is a 63 y.o. female with a past medical history significant for history of small bowel obstruction, hypotension who presents with concern for constipation, vomiting for the last 2 days with worsening abdominal pain and distention.  Patient reports that she cannot tolerate p.o. without vomiting.  She reports this feels the same as her most recent bowel obstruction, previously treated nonsurgically.  History of abdominal hysterectomy, no upper abdominal surgeries.  =   Constipation Associated symptoms: abdominal pain   Abdominal Pain Associated symptoms: constipation        Home Medications Prior to Admission medications   Medication Sig Start Date End Date Taking? Authorizing Provider  Zoster Vaccine Adjuvanted [facility]) injection Please administer 2nd dose 2 months after the initial Patient not taking: Reported on 03/09/2023 10/13/22   Arn Lane, MD      Allergies    Beef-derived drug products and Pork-derived products    Review of Systems   Review of Systems  Gastrointestinal:  Positive for abdominal pain and constipation.  All other systems reviewed and are negative.   Physical Exam Updated Vital Signs BP (!) 117/56 (BP Location: Right Arm)   Pulse 62   Temp 98.4 F (36.9 C) (Oral)   Resp 19   Ht 5\' 5"  (1.651 m)   Wt 66.7 kg   SpO2 100%   BMI 24.46 kg/m  Physical Exam Vitals and nursing note reviewed.  Constitutional:      General: She is not in acute distress.    Appearance: Normal appearance.  HENT:     Head: Normocephalic and atraumatic.  Eyes:     General:        Right eye: No discharge.        Left eye: No discharge.  Cardiovascular:     Rate and Rhythm: Normal rate and regular rhythm.     Heart sounds: No  murmur heard.    No friction rub. No gallop.  Pulmonary:     Effort: Pulmonary effort is normal.     Breath sounds: Normal breath sounds.  Abdominal:     General: Bowel sounds are normal.     Palpations: Abdomen is soft.     Comments: Mild tenderness to palpation epigastric region, hypoactive bowel sounds throughout, some mild distention, no rebound, rigidity, guarding.  Skin:    General: Skin is warm and dry.     Capillary Refill: Capillary refill takes less than 2 seconds.  Neurological:     Mental Status: She is alert and oriented to person, place, and time.  Psychiatric:        Mood and Affect: Mood normal.        Behavior: Behavior normal.     ED Results / Procedures / Treatments   Labs (all labs ordered are listed, but only abnormal results are displayed) Labs Reviewed  COMPREHENSIVE METABOLIC PANEL WITH GFR - Abnormal; Notable for the following components:      Result Value   Glucose, Bld 120 (*)    Total Bilirubin 1.3 (*)    All other components within normal limits  LIPASE, BLOOD  CBC    EKG None  Radiology CT ABDOMEN PELVIS W CONTRAST Result Date: 03/24/2024 CLINICAL DATA:  Abdominal pain, acute, nonlocalized.  EXAM: CT ABDOMEN AND PELVIS WITH CONTRAST TECHNIQUE: Multidetector CT imaging of the abdomen and pelvis was performed using the standard protocol following bolus administration of intravenous contrast. RADIATION DOSE REDUCTION: This exam was performed according to the departmental dose-optimization program which includes automated exposure control, adjustment of the mA and/or kV according to patient size and/or use of iterative reconstruction technique. CONTRAST:  75mL OMNIPAQUE  IOHEXOL  350 MG/ML SOLN COMPARISON:  CT scan abdomen and pelvis from 01/25/2023. FINDINGS: Lower chest: There are patchy atelectatic changes in the visualized lung bases. No overt consolidation. No pleural effusion. The heart is normal in size. No pericardial effusion. Hepatobiliary: The  liver is normal in size. Non-cirrhotic configuration. No suspicious mass. No intrahepatic or extrahepatic bile duct dilation. Small volume dependent 5-7 mm sized calcified gallstones noted without imaging signs of acute cholecystitis. Normal gallbladder wall thickness. No pericholecystic inflammatory changes. Pancreas: Unremarkable. No pancreatic ductal dilatation or surrounding inflammatory changes. Spleen: Within normal limits. No focal lesion. Adrenals/Urinary Tract: Adrenal glands are unremarkable. No suspicious renal mass. No hydronephrosis. No renal or ureteric calculi. Unremarkable urinary bladder. Stomach/Bowel: There are multiple dilated loops of distal jejunum and proximal ileum up to a point of narrowing in the anterior right lower quadrant (series 5, image 53 and series 6, image 84). Distal to this point remaining distal small bowel loops and colon are nondilated. There is no discrete mass or focal abdominal wall thickening noted. No perienteric fat stranding or prominence of vasa recta. Findings are likely secondary to adhesion in the right lower quadrant, causing small bowel obstruction. No pneumatosis, pneumoperitoneum or portal venous gas. No associated walled-off abscess or loculated collection. Note is made of small dependent ascites in the pelvis, likely reactive. Portion of appendix is seen in the right lower quadrant (series 6, image 68), without evidence of appendicitis. Vascular/Lymphatic: No abdominal or pelvic lymphadenopathy, by size criteria. No aneurysmal dilation of the major abdominal arteries. Reproductive: The uterus is surgically absent. No large adnexal mass. Other: The visualized soft tissues and abdominal wall are unremarkable. Musculoskeletal: No suspicious osseous lesions. There are mild multilevel degenerative changes in the visualized spine. IMPRESSION: 1. Findings compatible with small bowel obstruction with transition point in the right lower quadrant, most likely secondary  to adhesion. No pneumatosis, pneumoperitoneum or portal venous gas. No walled-off abscess or loculated collection. Small dependent pelvic ascites, likely reactive. 2. Multiple other nonacute observations, as described above. Electronically Signed   By: Beula Brunswick M.D.   On: 03/24/2024 14:14    Procedures Procedures    Medications Ordered in ED Medications  ondansetron  (ZOFRAN -ODT) disintegrating tablet 4 mg (4 mg Oral Given 03/24/24 1013)  iohexol  (OMNIPAQUE ) 350 MG/ML injection 75 mL (75 mLs Intravenous Contrast Given 03/24/24 1337)    ED Course/ Medical Decision Making/ A&P Clinical Course as of 03/24/24 1539  Fri Mar 24, 2024  1521 S- admit SBO, surgery on board, NG tube in [ ]  FM handoff [AO]    Clinical Course User Index [AO] Lorain Robson, MD                                 Medical Decision Making Amount and/or Complexity of Data Reviewed Labs: ordered. Radiology: ordered.  Risk Prescription drug management.   This patient is a 63 y.o. female  who presents to the ED for concern of nausea, vomiting, abdominal pain.   Differential diagnoses prior to evaluation: The emergent differential diagnosis includes, but  is not limited to,  The causes of generalized abdominal pain include but are not limited to AAA, mesenteric ischemia, appendicitis, diverticulitis, DKA, gastritis, gastroenteritis, AMI, nephrolithiasis, pancreatitis, peritonitis, adrenal insufficiency,lead poisoning, iron toxicity, intestinal ischemia, constipation, UTI,SBO/LBO, splenic rupture, biliary disease, IBD, IBS, PUD, or hepatitis -- given her history suspected bowel obstruction . This is not an exhaustive differential.   Past Medical History / Co-morbidities / Social History: Previous bowel obstruction, previous hysterectomy, otherwise overall noncontributory past medical history  Additional history: Chart reviewed. Pertinent results include: Reviewed the admission including lab work, imaging from her  previous admission for SBO around 1 year ago.  Physical Exam: Physical exam performed. The pertinent findings include: Initially somewhat hypertensive, blood pressure 140/64, repeat blood pressure 117/56.  Vital signs otherwise stable, diffuse abdominal tenderness, hypoactive bowel sounds, mild distention, no rebound, rigidity, guarding.  Lab Tests/Imaging studies: I personally interpreted labs/imaging and the pertinent results include: CBC unremarkable, unremarkable lipase, CMP with very mildly elevated total bilirubin, otherwise unremarkable.  CT abdomen pelvis with contrast with findings of acute bowel obstruction, with transition point in right lower quadrant. I agree with the radiologist interpretation.   Medications: I ordered medication including zofran  for nausea, NG tube placed for acute bowel obstruction.   Consults: Spoke with surgery, Lavern Potash PA who agrees the patient will need surgical consultation for small bowel obstruction, they recommend medicine admission, consult call pending to family medicine at this time -- consult handed off to oncoming team, Dr. Delana Favors and Resident MD, Lorain Robson.  Final Clinical Impression(s) / ED Diagnoses Final diagnoses:  Intestinal obstruction, unspecified cause, unspecified whether partial or complete [facility])    Rx / DC Orders ED Discharge Orders     None         Nelly Banco, PA-C 03/24/24 1534    Stefan Edge 03/24/24 1539    Dorenda Gandy, MD 03/25/24 [phone]

## 2024-03-24 NOTE — H&P (Cosign Needed Addendum)
 Hospital Admission History and Physical Service Pager: [phone]  Patient name: Madison Booker Medical record number: [id number] Date of Birth: December 08, 1960 Age: 63 y.o. Gender: female  Primary Care Provider: Arn Lane, MD Consultants: General Surgery Code Status: Full code Preferred Emergency Contact: Husband  Contact Information     Name Relation Home Work Mobile   Laine,debo Spouse   [phone]      Other Contacts   None on File      Chief Complaint: abdominal pain, constipation  Assessment and Plan: Madison Booker is a 63 y.o. female presenting with constipation, abdominal pain - found on imaging to have small bowel obstruction. Differential for presentation of this includes SBO due to adhesions, fecal impaction.   - Given evidence of SBO on imaging as well as patient past history of SBO and multiple abdominal surgeries, strongly suggest that this is related to adhesions. - Patient does not have history of constipation, typically moves her bowels daily, doubt this is primarily a fecal impaction picture.  Assessment & Plan Small bowel obstruction [facility]) Patient with multiple abdominal surgeries through her life, as well as prior episode of SBO in March 2024 which presented very similarly.  NG tube has been placed by general surgery for bowel decompression, hopefully patient will do well with this and be able to advance diet.  If she does not begin to improve she may require exploratory surgery.  At this time patient is very well-appearing on exam, VSS, no systemic concerns. - Admit to FMTS, attending Dr. Andree Kayser - MedSurg, Vital signs per floor - NPO with NG tube in place for bowel decompression - PT/OT to treat - IVF LR 100 mL/hr - AM CBC, BMP, Mag - Keep K > 4 and Mg > 2 for bowel function  - Mobilize for bowel function -Tylenol  suppository PRN for pain  FEN/GI: NPO VTE Prophylaxis: Lovenox  - has listed pork allergy, this is a dietary preference.   Patient is agreeable to receiving Lovenox  for VTE prophylaxis purposes.  Disposition: MedSurg  History of Present Illness:  Madison Booker is a 63 y.o. female presenting with constipation and feeling of "tightness" in her belly that started 2 days ago.  Then, yesterday morning she started vomiting and has since then been unable to eat.  She has been able to drink a little bit of Pedialyte.  Her last BM was very early yesterday morning.  She has not passed gas since then.  No blood in vomit or stool.  Denies frank abdominal pain.  No recent illnesses/fevers, denies shortness of breath, chest pain.  She did have a prior episode of small bowel obstruction last year, March.  Her symptoms now are very similar to that episode.  In the ED, abdominal CT showed small bowel obstruction possibly secondary to adhesions.  Lipase, CBC, CMP grossly WNL. Received Zofran  with some improvement of symptoms.  General surgery was consulted and recommended admission for conservative management and possible exploratory surgery if she does not improve.  NG tube placed.  Since placement of NG tube, pt feels abdominal tightness is improving.   Review Of Systems: Per HPI.  Pertinent Past Medical History: HLD SBO Remainder reviewed in history tab.   Pertinent Past Surgical History: Abdominal hysterectomy R oophorectomy Cesarean section Remainder reviewed in history tab.   Pertinent Social History: Tobacco use: no Alcohol use: no Other Substance use: no Lives with husband. Son is visiting.   Pertinent Family History: None Remainder reviewed in history  tab.   Important Outpatient Medications: None - no vitamins or supplements Remainder reviewed in medication history.   Objective: BP (!) 117/56 (BP Location: Right Arm)   Pulse 62   Temp 98.4 F (36.9 C) (Oral)   Resp 19   Ht 5\' 5"  (1.651 m)   Wt 66.7 kg   SpO2 100%   BMI 24.46 kg/m  Exam: General: Uncomfortable-appearing, pleasant,  conversant, no acute distress. HEENT: normocephalic, EOM grossly intact. NG tube in nares. Cardio: Regular rate, regular rhythm, no murmurs on exam. Pulm: Clear, no wheezing, no crackles. No increased work of breathing. Abdominal: bowel sounds present, soft, non-tender, non-distended. Extremities: no peripheral edema. Moves all extremities equally. Skin warm and dry. Neuro: Alert and oriented x3, speech normal in content, no facial asymmetry. Psych:  Cognition and judgment appear intact. Alert, communicative, and cooperative with normal attention span and concentration.   Labs:  CBC BMET  Recent Labs  Lab 03/24/24 1020  WBC 6.9  HGB 14.3  HCT 44.0  PLT 165   Recent Labs  Lab 03/24/24 1020  NA 137  K 3.9  CL 100  CO2 25  BUN 22  CREATININE 0.64  GLUCOSE 120*  CALCIUM 9.3    Lipase 21  5/2 CT A/P: IMPRESSION: 1. Findings compatible with small bowel obstruction with transition point in the right lower quadrant, most likely secondary to adhesion. No pneumatosis, pneumoperitoneum or portal venous gas. No walled-off abscess or loculated collection. Small dependent pelvic ascites, likely reactive. 2. Multiple other nonacute observations, as described above.  5/2 abdominal x-ray: IMPRESSION: Nasogastric tube tip and side hole in the proximal to mid stomach.   Omar Bibber, DO 03/24/2024, 4:41 PM PGY-1, [facility] Family Medicine  FPTS Intern pager: [phone], text pages welcome Secure chat group [facility] Teaching Service

## 2024-03-25 ENCOUNTER — Inpatient Hospital Stay (HOSPITAL_COMMUNITY)

## 2024-03-25 DIAGNOSIS — K56609 Unspecified intestinal obstruction, unspecified as to partial versus complete obstruction: Secondary | ICD-10-CM | POA: Diagnosis not present

## 2024-03-25 LAB — BASIC METABOLIC PANEL WITH GFR
Anion gap: 8 (ref 5–15)
BUN: 19 mg/dL (ref 8–23)
CO2: 28 mmol/L (ref 22–32)
Calcium: 8.4 mg/dL — ABNORMAL LOW (ref 8.9–10.3)
Chloride: 102 mmol/L (ref 98–111)
Creatinine, Ser: 0.64 mg/dL (ref 0.44–1.00)
GFR, Estimated: >60 mL/min (ref >60)
Glucose, Bld: 82 mg/dL (ref 70–99)
Potassium: 3.5 mmol/L (ref 3.5–5.1)
Sodium: 138 mmol/L (ref 135–145)

## 2024-03-25 LAB — CBC
HCT: 37.5 % (ref 36.0–46.0)
Hemoglobin: 12.3 g/dL (ref 12.0–15.0)
MCH: 31.5 pg (ref 26.0–34.0)
MCHC: 32.8 g/dL (ref 30.0–36.0)
MCV: 95.9 fL (ref 80.0–100.0)
Platelets: 182 10*3/uL (ref 150–400)
RBC: 3.91 MIL/uL (ref 3.87–5.11)
RDW: 12.7 % (ref 11.5–15.5)
WBC: 6.2 10*3/uL (ref 4.0–10.5)
nRBC: 0 % (ref 0.0–0.2)

## 2024-03-25 LAB — MAGNESIUM: Magnesium: 2.1 mg/dL (ref 1.7–2.4)

## 2024-03-25 LAB — HIV ANTIBODY (ROUTINE TESTING W REFLEX): HIV Screen 4th Generation wRfx: NONREACTIVE

## 2024-03-25 MED ORDER — POTASSIUM CHLORIDE 10 MEQ/100ML IV SOLN
10.0000 meq | INTRAVENOUS | Status: AC
  Administered 2024-03-25 (×4): 10 meq via INTRAVENOUS
  Filled 2024-03-25: qty 100

## 2024-03-25 NOTE — Plan of Care (Signed)

## 2024-03-25 NOTE — Assessment & Plan Note (Addendum)
 Patient with multiple abdominal surgeries through her life, as well as prior episode of SBO in March 2024 which presented very similarly.  NG tube has been placed by general surgery for bowel decompression. Patient feeling well, passing gas this morning, w/ appetite. She would like to start eating. Will see when Gen Surg wants to advance diet. VSS, no systemic concerns, DG abdomen continues to show partial SBO. -General Surgery following, appreciate recs - NPO with NG tube in place for bowel decompression - PT/OT to treat - IVF LR 100 mL/hr - AM CBC, BMP, Mag - Keep K > 4 and Mg > 2 for bowel function  - Mobilize for bowel function -Tylenol  suppository PRN for pain

## 2024-03-25 NOTE — Progress Notes (Signed)
 Subjective/Chief Complaint: Passing flatus, feels hungry, much less bloated. Thinks her NG has been clamped all night   Objective: Vital signs in last 24 hours: Temp:  [98.1 F (36.7 C)-98.7 F (37.1 C)] 98.7 F (37.1 C) (05/03 0445) Pulse Rate:  [59-66] 63 (05/03 0445) Resp:  [17-19] 17 (05/03 0445) BP: (117-140)/(54-64) 130/55 (05/03 0445) SpO2:  [97 %-100 %] 97 % (05/03 0445) Weight:  [66.7 kg] 66.7 kg (05/02 1010) Last BM Date : 03/23/24  Intake/Output from previous day: 05/02 0701 - 05/03 0700 In: 1006.2 [I.V.:1006.2] Out: 501 [Urine:1; Emesis/NG output:500] Intake/Output this shift: No intake/output data recorded.  A&O Unlabored respirations NG clamped, minimal tin light output in cannister Abd s/nt/nd  Lab Results:  Recent Labs    03/24/24 1020 03/25/24 0503  WBC 6.9 6.2  HGB 14.3 12.3  HCT 44.0 37.5  PLT 165 182   BMET Recent Labs    03/24/24 1020 03/25/24 0503  NA 137 138  K 3.9 3.5  CL 100 102  CO2 25 28  GLUCOSE 120* 82  BUN 22 19  CREATININE 0.64 0.64  CALCIUM 9.3 8.4*   PT/INR No results for input(s): "LABPROT", "INR" in the last 72 hours. ABG No results for input(s): "PHART", "HCO3" in the last 72 hours.  Invalid input(s): "PCO2", "PO2"  Studies/Results: DG Abd Portable 1V-Small Bowel Obstruction Protocol-initial, 8 hr delay Result Date: 03/25/2024 CLINICAL DATA:  8 hour small-bowel follow EXAM: PORTABLE ABDOMEN - 1 VIEW COMPARISON:  03/24/2024 FINDINGS: Administered contrast lies throughout the small bowel as well as the proximal colon. These changes are consistent with a partial small bowel obstruction. No free air is seen. IMPRESSION: Partial small bowel obstruction. There is some persistent small bowel dilatation noted. Electronically Signed   By: Violeta Grey M.D.   On: 03/25/2024 02:57   DG Abd Portable 1V Result Date: 03/24/2024 CLINICAL DATA:  Nasogastric tube placement EXAM: PORTABLE ABDOMEN - 1 VIEW COMPARISON:  01/30/2023  and abdomen and pelvis CT dated 03/24/2024. FINDINGS: Interval nasogastric tube with its tip in the proximal to mid stomach and side hole in the proximal to mid stomach. Partially included dilated bowel loops, better seen on the CT earlier today. Clear lung bases. Normal-sized heart. Excreted contrast in the renal collecting systems and proximal ureters. Unremarkable bones. IMPRESSION: Nasogastric tube tip and side hole in the proximal to mid stomach. Electronically Signed   By: Catherin Closs M.D.   On: 03/24/2024 15:38   CT ABDOMEN PELVIS W CONTRAST Result Date: 03/24/2024 CLINICAL DATA:  Abdominal pain, acute, nonlocalized. EXAM: CT ABDOMEN AND PELVIS WITH CONTRAST TECHNIQUE: Multidetector CT imaging of the abdomen and pelvis was performed using the standard protocol following bolus administration of intravenous contrast. RADIATION DOSE REDUCTION: This exam was performed according to the departmental dose-optimization program which includes automated exposure control, adjustment of the mA and/or kV according to patient size and/or use of iterative reconstruction technique. CONTRAST:  75mL OMNIPAQUE  IOHEXOL  350 MG/ML SOLN COMPARISON:  CT scan abdomen and pelvis from 01/25/2023. FINDINGS: Lower chest: There are patchy atelectatic changes in the visualized lung bases. No overt consolidation. No pleural effusion. The heart is normal in size. No pericardial effusion. Hepatobiliary: The liver is normal in size. Non-cirrhotic configuration. No suspicious mass. No intrahepatic or extrahepatic bile duct dilation. Small volume dependent 5-7 mm sized calcified gallstones noted without imaging signs of acute cholecystitis. Normal gallbladder wall thickness. No pericholecystic inflammatory changes. Pancreas: Unremarkable. No pancreatic ductal dilatation or surrounding inflammatory changes. Spleen: Within  normal limits. No focal lesion. Adrenals/Urinary Tract: Adrenal glands are unremarkable. No suspicious renal mass. No  hydronephrosis. No renal or ureteric calculi. Unremarkable urinary bladder. Stomach/Bowel: There are multiple dilated loops of distal jejunum and proximal ileum up to a point of narrowing in the anterior right lower quadrant (series 5, image 53 and series 6, image 84). Distal to this point remaining distal small bowel loops and colon are nondilated. There is no discrete mass or focal abdominal wall thickening noted. No perienteric fat stranding or prominence of vasa recta. Findings are likely secondary to adhesion in the right lower quadrant, causing small bowel obstruction. No pneumatosis, pneumoperitoneum or portal venous gas. No associated walled-off abscess or loculated collection. Note is made of small dependent ascites in the pelvis, likely reactive. Portion of appendix is seen in the right lower quadrant (series 6, image 68), without evidence of appendicitis. Vascular/Lymphatic: No abdominal or pelvic lymphadenopathy, by size criteria. No aneurysmal dilation of the major abdominal arteries. Reproductive: The uterus is surgically absent. No large adnexal mass. Other: The visualized soft tissues and abdominal wall are unremarkable. Musculoskeletal: No suspicious osseous lesions. There are mild multilevel degenerative changes in the visualized spine. IMPRESSION: 1. Findings compatible with small bowel obstruction with transition point in the right lower quadrant, most likely secondary to adhesion. No pneumatosis, pneumoperitoneum or portal venous gas. No walled-off abscess or loculated collection. Small dependent pelvic ascites, likely reactive. 2. Multiple other nonacute observations, as described above. Electronically Signed   By: Beula Brunswick M.D.   On: 03/24/2024 14:14    Anti-infectives: Anti-infectives (From admission, onward)    None       Assessment/Plan: SBO H/o C-section, hysterectomy, oophorectomy.  - CT w/ Findings compatible with small bowel obstruction with transition point in the  right lower quadrant, most likely secondary to adhesion. No pneumatosis, pneumoperitoneum or portal venous gas. No walled-off abscess or loculated collection. Small dependent pelvic ascites, likely reactive. - No current indication for emergency surgery - AF, VSS (mild hypotension), WBC nml - Contrast in colon on 8hr protocol film. Will try clears, DC NG this afternoon if tolerating.  - Keep K > 4 and Mg > 2 for bowel function- IV K+ ordered - Mobilize for bowel function - Hopefully patient will improve with conservative management. If patient fails to improve with conservative management, she may require exploratory surgery during admission - Agree with medical admission. We will follow with you.      FEN: clamp/clears, IVF per primary ID: none indicated VTE: okay for chemical prophylaxis from surgical standpoint - pork allergy noted in chart discussed with patient and she does not have true pork allergy but prefers not to eat it and is willing to take lovenox  or subcutaneous heparin   I reviewed ED provider notes, last 24 h vitals and pain scores, last 48 h intake and output, last 24 h labs and trends, and last 24 h imaging results.    LOS: 1 day    Adalberto Acton 03/25/2024

## 2024-03-25 NOTE — Progress Notes (Signed)
     Daily Progress Note Intern Pager: [phone]  Patient name: Madison Booker Medical record number: [id number] Date of birth: [birth year] Age: 63 y.o. Gender: female  Primary Care Provider: Arn Lane, MD Consultants: General Surgery Code Status: Full Code  Pt Overview and Major Events to Date:  03/24/24 - Admitted  Assessment and Plan:  Madison Booker is a 63 yo woman with PMH of HLD, previous SBO, multiple abdominal surgeries, who presents with SBO.  Assessment & Plan Small bowel obstruction [facility]) Patient with multiple abdominal surgeries through her life, as well as prior episode of SBO in March 2024 which presented very similarly.  NG tube has been placed by general surgery for bowel decompression. Patient feeling well, passing gas this morning, w/ appetite. She would like to start eating. Will see when Gen Surg wants to advance diet. VSS, no systemic concerns, DG abdomen continues to show partial SBO. -General Surgery following, appreciate recs - NPO with NG tube in place for bowel decompression - PT/OT to treat - IVF LR 100 mL/hr - AM CBC, BMP, Mag - Keep K > 4 and Mg > 2 for bowel function  - Mobilize for bowel function -Tylenol  suppository PRN for pain   FEN/GI: NPO PPx: Lovenox  Dispo:Home pending clinical improvement .  Subjective:  Doing well this morning, has passed gas and is wanting to try eating/has appetite.  Objective: Temp:  [98.1 F (36.7 C)-98.7 F (37.1 C)] 98.7 F (37.1 C) (05/03 0445) Pulse Rate:  [59-66] 63 (05/03 0445) Resp:  [17-19] 17 (05/03 0445) BP: (117-140)/(54-64) 130/55 (05/03 0445) SpO2:  [97 %-100 %] 97 % (05/03 0445) Weight:  [66.7 kg] 66.7 kg (05/02 1010) Physical Exam: General: NAD, conversant, good mood Cardiovascular: RRR, NRMG Respiratory: CTABL Abdomen: Soft, NTTP, non-distended, active bowel sounds Extremities: Moving all extremities  Laboratory: Most recent CBC Lab Results  Component Value Date   WBC 6.9  03/24/2024   HGB 14.3 03/24/2024   HCT 44.0 03/24/2024   MCV 96.7 03/24/2024   PLT 165 03/24/2024   Most recent BMP    Latest Ref Rng & Units 03/24/2024   10:20 AM  BMP  Glucose 70 - 99 mg/dL 518   BUN 8 - 23 mg/dL 22   Creatinine 8.41 - 1.00 mg/dL 6.60   Sodium 630 - 160 mmol/L 137   Potassium 3.5 - 5.1 mmol/L 3.9   Chloride 98 - 111 mmol/L 100   CO2 22 - 32 mmol/L 25   Calcium 8.9 - 10.3 mg/dL 9.3      Imaging/Diagnostic Tests: DG Abd SPO protocol: Partial small bowel obstruction, persistent small bowel dilation.  Wilhemena Harbour, MD 03/25/2024, 5:36 AM  PGY-3, [facility] Family Medicine FPTS Intern pager: [phone], text pages welcome Secure chat group [facility] Teaching Service

## 2024-03-25 NOTE — Progress Notes (Addendum)
 Metzger sent msg. Patient reports med BM normal color and consistency. Patient also reports ability to tolerate clear liquids and would like to have NGT removed if possible. Response received by Aldon Hung: "Ok to remove NG."Patient not able to tolerate full liquid at this time, switching her back to clear liquid. Patient states full liquid causes diarrhea.

## 2024-03-25 NOTE — Progress Notes (Signed)
 OT Cancellation Note  Patient Details Name: ATASIA GLEAVES MRN: [id number] DOB: 1961/08/09   Cancelled Treatment:    Reason Eval/Treat Not Completed: (P) OT screened, no needs identified, will sign off. Pt independent, has help at home from husband/son, no acute OT or follow up needed.  Scherry Curtis 03/25/2024, 4:00 PM

## 2024-03-25 NOTE — Evaluation (Signed)
 Physical Therapy Brief Evaluation and Discharge Note Patient Details Name: Madison Booker MRN: [id number] DOB: 1961/03/05 Today's Date: 03/25/2024   History of Present Illness  Madison Booker is a 63 y.o. female presenting with constipation, abdominal pain - found on imaging to have small bowel obstruction. PMH of HLD, previous SBO, multiple abdominal surgeries  Clinical Impression  Pt presents with admitting diagnosis above. Pt today was able to ambulate in hallway with no AD independently. PTA pt was fully independent. Pt presents at or near baseline mobility. Pt has no further acute PT needs and will be signing off. Pt would benefit from continued mobility with mobility specialist during acute stay. Re consult PT if mobility status changes.       PT Assessment Patient does not need any further PT services  Assistance Needed at Discharge  PRN    Equipment Recommendations None recommended by PT  Recommendations for Other Services       Precautions/Restrictions Precautions Precautions: Fall Restrictions Weight Bearing Restrictions Per Provider Order: No        Mobility  Bed Mobility   Supine/Sidelying to sit: Independent      Transfers Overall transfer level: Independent Equipment used: None                    Ambulation/Gait Ambulation/Gait assistance: Independent Gait Distance (Feet): 600 Feet Assistive device: None Gait Pattern/deviations: WFL(Within Functional Limits), Trunk flexed Gait Speed: Pace WFL General Gait Details: No LOB noted. Slowed step through pattern.  Home Activity Instructions    Stairs Stairs: Yes Stairs assistance: Independent Stair Management: No rails, Alternating pattern, Forwards Number of Stairs: 2 General stair comments: no LOB noted.  Modified Rankin (Stroke Patients Only)        Balance Overall balance assessment: Independent                        Pertinent Vitals/Pain PT - Brief Vital  Signs All Vital Signs Stable: Yes Pain Assessment Pain Assessment: No/denies pain     Home Living Family/patient expects to be discharged to:: Private residence Living Arrangements: Spouse/significant other Available Help at Discharge: Family;Available PRN/intermittently Home Environment: Stairs to enter;No rail  Stairs-Number of Steps: 2 Home Equipment: None        Prior Function Level of Independence: Independent      UE/LE Assessment   UE ROM/Strength/Tone/Coordination: WFL    LE ROM/Strength/Tone/Coordination: [facility]      Communication   Communication Communication: No apparent difficulties     Cognition Overall Cognitive Status: Appears within functional limits for tasks assessed/performed       General Comments General comments (skin integrity, edema, etc.): VSS    Exercises     Assessment/Plan    PT Problem List         PT Visit Diagnosis Other abnormalities of gait and mobility (R26.89)    No Skilled PT Patient at baseline level of functioning;Patient is independent with all acitivity/mobility   Co-evaluation                AMPAC 6 Clicks Help needed turning from your back to your side while in a flat bed without using bedrails?: None Help needed moving from lying on your back to sitting on the side of a flat bed without using bedrails?: None Help needed moving to and from a bed to a chair (including a wheelchair)?: None Help needed standing up from a chair using your arms (e.g., wheelchair or bedside  chair)?: None Help needed to walk in hospital room?: None Help needed climbing 3-5 steps with a railing? : None 6 Click Score: 24      End of Session Equipment Utilized During Treatment: Gait belt Activity Tolerance: Patient tolerated treatment well Patient left: in chair;with call bell/phone within reach Nurse Communication: Mobility status PT Visit Diagnosis: Other abnormalities of gait and mobility (R26.89)     Time: 1610-9604 PT  Time Calculation (min) (ACUTE ONLY): 20 min  Charges:   PT Evaluation $PT Eval Moderate Complexity: 1 Mod      Eveleigh Crumpler B, PT, DPT Acute Rehab Services [phone]   Dyani Babel  03/25/2024, 1:25 PM

## 2024-03-26 DIAGNOSIS — K56609 Unspecified intestinal obstruction, unspecified as to partial versus complete obstruction: Secondary | ICD-10-CM | POA: Diagnosis not present

## 2024-03-26 LAB — BASIC METABOLIC PANEL WITH GFR
Anion gap: 5 (ref 5–15)
BUN: 6 mg/dL — ABNORMAL LOW (ref 8–23)
CO2: 27 mmol/L (ref 22–32)
Calcium: 8.5 mg/dL — ABNORMAL LOW (ref 8.9–10.3)
Chloride: 106 mmol/L (ref 98–111)
Creatinine, Ser: 0.52 mg/dL (ref 0.44–1.00)
GFR, Estimated: >60 mL/min (ref >60)
Glucose, Bld: 85 mg/dL (ref 70–99)
Potassium: 4 mmol/L (ref 3.5–5.1)
Sodium: 138 mmol/L (ref 135–145)

## 2024-03-26 LAB — MAGNESIUM: Magnesium: 1.9 mg/dL (ref 1.7–2.4)

## 2024-03-26 MED ORDER — MAGNESIUM SULFATE IN D5W 1-5 GM/100ML-% IV SOLN
1.0000 g | Freq: Once | INTRAVENOUS | Status: AC
  Administered 2024-03-26: 1 g via INTRAVENOUS
  Filled 2024-03-26: qty 100

## 2024-03-26 NOTE — Discharge Summary (Signed)
   Family Medicine Teaching [facility] Discharge Summary  Patient name: Madison Booker Medical record number: [id number] Date of birth: 06-16-1961 Age: 63 y.o. Gender: female Date of Admission: 03/24/2024  Date of Discharge: 03/26/2024 Admitting Physician: Omar Bibber, DO  Primary Care Provider: Arn Lane, MD Consultants: General Surgery  Indication for Hospitalization: Small bowel obstruction  Brief Hospital Course:  SEPHANIE RODMAN is a 63 y.o.female with a history of small bowel obstruction in 2024 who was [facility] Medicine Teaching Service at [facility] for small bowel obstruction.   Her hospital course is detailed below:  Small bowel obstruction Prior to presentation to ED had 2 days of belly tightening, constipation, vomiting very similar to her last SBO.  On arrival CT imaging did show small bowel obstruction possibly secondary to adhesions.  Lab workup with CBC, CMP, lipase was grossly WNL.  General surgery was consulted and recommended admission for conservative management with possible exploratory surgery pending improvement.  NG tube was placed for gastric decompression. Pt was able to have NG tube removed, and then was able to advance to clear liquid diet prior to discharge.  PCP Follow-up Recommendations: Ensure improvement of PO intake. Advance back to regular diet as tolerated.    Discharge Diagnoses/Problem List:  Small bowel obstruction  Disposition: Home  Discharge Condition: Improved and stable  Discharge Exam:   Per 03/26/24 Progress Note by Dr. Alida Ion:  [98.2 F (36.8 C)-99.1 F (37.3 C)] 98.2 F (36.8 C) (05/04 0456) Pulse Rate:  [51-65] 51 (05/04 0456) Resp:  [16-18] 16 (05/04 0456) BP: (101-122)/(48-54) 113/54 (05/04 0456) SpO2:  [99 %-100 %] 100 % (05/04 0456) Physical Exam: General: NAD Cardiovascular: RRR no murmur Respiratory: CTAB normal WOB on RA Abdomen: Soft NTND Extremities: No significant edema   Significant  Procedures: none  Significant Labs and Imaging:  Recent Labs  Lab 03/25/24 0503  WBC 6.2  HGB 12.3  HCT 37.5  PLT 182   Recent Labs  Lab 03/25/24 0503 03/26/24 0342  NA 138 138  K 3.5 4.0  CL 102 106  CO2 28 27  GLUCOSE 82 85  BUN 19 6*  CREATININE 0.64 0.52  CALCIUM 8.4* 8.5*  MG 2.1 1.9    Results/Tests Pending at Time of Discharge: none  Discharge Medications:  Allergies as of 03/26/2024       Reactions   Beef-derived Drug Products    Pork-derived Products         Medication List    You have not been prescribed any medications.     Discharge Instructions: Please refer to Patient Instructions section of EMR for full details.  Patient was counseled important signs and symptoms that should prompt return to medical care, changes in medications, dietary instructions, activity restrictions, and follow up appointments.   Follow-Up Appointments: 04/25/24 w/ PCP Dr. Jarrell Merritts, MD 03/26/2024, 12:03 PM PGY-2, [facility] Family Medicine

## 2024-03-26 NOTE — Discharge Instructions (Addendum)
 You were admitted to the hospital for small bowel obstruction, which is a blockage in your bowel.  We decompressed the pressure in your abdomen, and your bowels were able to unblock again. -Continue to eat soft pured foods and soups.  You can slowly incorporate more food as tolerated.  Please slow down if you notice that your nausea or stomach pain starts to worsen again. -You are at risk of another small bowel obstruction in the future.  Please seek further medical attention if you notice worsening abdominal pain, worsening nausea, or inability to pass gas or have a bowel movement.  Follow-up with your primary doctor in 1 month. We have scheduled an appointment for you.

## 2024-03-26 NOTE — Progress Notes (Signed)
     Daily Progress Note Intern Pager: [phone]  Patient name: Madison Booker Medical record number: [id number] Date of birth: May 28, 1961 Age: 63 y.o. Gender: female  Primary Care Provider: Arn Lane, MD Consultants: General Surgery Code Status: Full code  Pt Overview and Major Events to Date:  5/2 admitted 5/3 NG tube removed  Assessment and Plan:  Madison Booker is a 63 y.o. female presenting with SBO likely 2/2 adhesions from prior abdominal surgery. Pertinent PMH/PSH includes HLD, previous SBO, multiple abdominal surgeries.  Assessment & Plan Small bowel obstruction (HCC) NG tube removed 5/3, now on clear liquid diet. -General Surgery following, appreciate recs - PT/OT  - AM BMP, Mag - Keep K > 4 and Mg > 2 for bowel function  - Mobilize for bowel function -Tylenol  suppository PRN for pain   FEN/GI: Clear liquids, advance per surgery recs PPx: Lovenox  Dispo:Home pending clinical improvement .    Subjective:  NAEON, tolerating clear liquids has not been able to tolerate liquids quite yet.  States that she gets diarrhea with liquids.  Has any nausea/vomiting/abdominal pain.  Has been able to walk around.  Objective: Temp:  [98.2 F (36.8 C)-99.1 F (37.3 C)] 98.2 F (36.8 C) (05/04 0456) Pulse Rate:  [51-65] 51 (05/04 0456) Resp:  [16-18] 16 (05/04 0456) BP: (101-122)/(48-54) 113/54 (05/04 0456) SpO2:  [99 %-100 %] 100 % (05/04 0456) Physical Exam: General: NAD Cardiovascular: RRR no murmur Respiratory: CTAB normal WOB on RA Abdomen: Soft NTND Extremities: No significant edema  Laboratory: Most recent CBC Lab Results  Component Value Date   WBC 6.2 03/25/2024   HGB 12.3 03/25/2024   HCT 37.5 03/25/2024   MCV 95.9 03/25/2024   PLT 182 03/25/2024   Most recent BMP    Latest Ref Rng & Units 03/26/2024    3:42 AM  BMP  Glucose 70 - 99 mg/dL 85   BUN 8 - 23 mg/dL 6   Creatinine 1.47 - 8.29 mg/dL 5.62   Sodium 130 - 865 mmol/L 138    Potassium 3.5 - 5.1 mmol/L 4.0   Chloride 98 - 111 mmol/L 106   CO2 22 - 32 mmol/L 27   Calcium 8.9 - 10.3 mg/dL 8.5     Magnesium 1.9  Edison Gore, MD 03/26/2024, 5:37 AM  PGY-2, [place] [facility] FPTS Intern pager: [phone], text pages welcome Secure chat group [facility] Teaching Service

## 2024-03-26 NOTE — Plan of Care (Signed)

## 2024-03-26 NOTE — Progress Notes (Signed)
 Removed IV-CDI. Reviewed d/c paperwork with patient and answered questions.

## 2024-03-26 NOTE — Assessment & Plan Note (Addendum)
 NG tube removed 5/3, now on clear liquid diet. -General Surgery following, appreciate recs - PT/OT  - AM BMP, Mag - Keep K > 4 and Mg > 2 for bowel function  - Mobilize for bowel function -Tylenol  suppository PRN for pain

## 2024-03-26 NOTE — Progress Notes (Signed)
 Subjective/Chief Complaint: NG out and tolerating liquids without bloating, nausea or pain. Some diarrhea   Objective: Vital signs in last 24 hours: Temp:  [98.2 F (36.8 C)-99.1 F (37.3 C)] 98.3 F (36.8 C) (05/04 0748) Pulse Rate:  [51-65] 63 (05/04 0748) Resp:  [16-18] 18 (05/04 0748) BP: (101-122)/(48-54) 110/48 (05/04 0748) SpO2:  [98 %-100 %] 98 % (05/04 0748) Last BM Date : 03/25/24  Intake/Output from previous day: 05/03 0701 - 05/04 0700 In: 3041 [P.O.:1440; I.V.:1192.9; IV Piggyback:408.1] Out: -  Intake/Output this shift: No intake/output data recorded.  A&O Unlabored respirations Abd s/nt/nd  Lab Results:  Recent Labs    03/24/24 1020 03/25/24 0503  WBC 6.9 6.2  HGB 14.3 12.3  HCT 44.0 37.5  PLT 165 182   BMET Recent Labs    03/25/24 0503 03/26/24 0342  NA 138 138  K 3.5 4.0  CL 102 106  CO2 28 27  GLUCOSE 82 85  BUN 19 6*  CREATININE 0.64 0.52  CALCIUM 8.4* 8.5*   PT/INR No results for input(s): "LABPROT", "INR" in the last 72 hours. ABG No results for input(s): "PHART", "HCO3" in the last 72 hours.  Invalid input(s): "PCO2", "PO2"  Studies/Results: DG Abd Portable 1V-Small Bowel Obstruction Protocol-initial, 8 hr delay Result Date: 03/25/2024 CLINICAL DATA:  8 hour small-bowel follow EXAM: PORTABLE ABDOMEN - 1 VIEW COMPARISON:  03/24/2024 FINDINGS: Administered contrast lies throughout the small bowel as well as the proximal colon. These changes are consistent with a partial small bowel obstruction. No free air is seen. IMPRESSION: Partial small bowel obstruction. There is some persistent small bowel dilatation noted. Electronically Signed   By: Violeta Grey M.D.   On: 03/25/2024 02:57   DG Abd Portable 1V Result Date: 03/24/2024 CLINICAL DATA:  Nasogastric tube placement EXAM: PORTABLE ABDOMEN - 1 VIEW COMPARISON:  01/30/2023 and abdomen and pelvis CT dated 03/24/2024. FINDINGS: Interval nasogastric tube with its tip in the proximal  to mid stomach and side hole in the proximal to mid stomach. Partially included dilated bowel loops, better seen on the CT earlier today. Clear lung bases. Normal-sized heart. Excreted contrast in the renal collecting systems and proximal ureters. Unremarkable bones. IMPRESSION: Nasogastric tube tip and side hole in the proximal to mid stomach. Electronically Signed   By: Catherin Closs M.D.   On: 03/24/2024 15:38   CT ABDOMEN PELVIS W CONTRAST Result Date: 03/24/2024 CLINICAL DATA:  Abdominal pain, acute, nonlocalized. EXAM: CT ABDOMEN AND PELVIS WITH CONTRAST TECHNIQUE: Multidetector CT imaging of the abdomen and pelvis was performed using the standard protocol following bolus administration of intravenous contrast. RADIATION DOSE REDUCTION: This exam was performed according to the departmental dose-optimization program which includes automated exposure control, adjustment of the mA and/or kV according to patient size and/or use of iterative reconstruction technique. CONTRAST:  75mL OMNIPAQUE  IOHEXOL  350 MG/ML SOLN COMPARISON:  CT scan abdomen and pelvis from 01/25/2023. FINDINGS: Lower chest: There are patchy atelectatic changes in the visualized lung bases. No overt consolidation. No pleural effusion. The heart is normal in size. No pericardial effusion. Hepatobiliary: The liver is normal in size. Non-cirrhotic configuration. No suspicious mass. No intrahepatic or extrahepatic bile duct dilation. Small volume dependent 5-7 mm sized calcified gallstones noted without imaging signs of acute cholecystitis. Normal gallbladder wall thickness. No pericholecystic inflammatory changes. Pancreas: Unremarkable. No pancreatic ductal dilatation or surrounding inflammatory changes. Spleen: Within normal limits. No focal lesion. Adrenals/Urinary Tract: Adrenal glands are unremarkable. No suspicious renal mass. No hydronephrosis. No  renal or ureteric calculi. Unremarkable urinary bladder. Stomach/Bowel: There are multiple  dilated loops of distal jejunum and proximal ileum up to a point of narrowing in the anterior right lower quadrant (series 5, image 53 and series 6, image 84). Distal to this point remaining distal small bowel loops and colon are nondilated. There is no discrete mass or focal abdominal wall thickening noted. No perienteric fat stranding or prominence of vasa recta. Findings are likely secondary to adhesion in the right lower quadrant, causing small bowel obstruction. No pneumatosis, pneumoperitoneum or portal venous gas. No associated walled-off abscess or loculated collection. Note is made of small dependent ascites in the pelvis, likely reactive. Portion of appendix is seen in the right lower quadrant (series 6, image 68), without evidence of appendicitis. Vascular/Lymphatic: No abdominal or pelvic lymphadenopathy, by size criteria. No aneurysmal dilation of the major abdominal arteries. Reproductive: The uterus is surgically absent. No large adnexal mass. Other: The visualized soft tissues and abdominal wall are unremarkable. Musculoskeletal: No suspicious osseous lesions. There are mild multilevel degenerative changes in the visualized spine. IMPRESSION: 1. Findings compatible with small bowel obstruction with transition point in the right lower quadrant, most likely secondary to adhesion. No pneumatosis, pneumoperitoneum or portal venous gas. No walled-off abscess or loculated collection. Small dependent pelvic ascites, likely reactive. 2. Multiple other nonacute observations, as described above. Electronically Signed   By: Beula Brunswick M.D.   On: 03/24/2024 14:14    Anti-infectives: Anti-infectives (From admission, onward)    None       Assessment/Plan: SBO H/o C-section, hysterectomy, oophorectomy.  - CT w/ Findings compatible with small bowel obstruction with transition point in the right lower quadrant, most likely secondary to adhesion. No pneumatosis, pneumoperitoneum or portal venous gas.  No walled-off abscess or loculated collection. Small dependent pelvic ascites, likely reactive. - Contrast in colon on 8hr protocol film yesterday, NG removed.Tolerating liquids without issue and having bowel function.  - Keep K > 4 and Mg > 2 for bowel function - OK for discharge from surgery standpoint     FEN: clamp/clears, IVF per primary ID: none indicated VTE: okay for chemical prophylaxis from surgical standpoint - pork allergy noted in chart discussed with patient and she does not have true pork allergy but prefers not to eat it and is willing to take lovenox  or subcutaneous heparin   I reviewed provider notes, last 24 h vitals and pain scores, last 48 h intake and output, last 24 h labs and trends, and last 24 h imaging results.    LOS: 2 days    Adalberto Acton 03/26/2024

## 2024-03-27 ENCOUNTER — Other Ambulatory Visit: Payer: Self-pay | Admitting: Family Medicine

## 2024-03-27 ENCOUNTER — Telehealth: Payer: Self-pay

## 2024-03-27 ENCOUNTER — Encounter: Payer: Self-pay | Admitting: Family Medicine

## 2024-03-27 DIAGNOSIS — R634 Abnormal weight loss: Secondary | ICD-10-CM

## 2024-03-27 DIAGNOSIS — K56609 Unspecified intestinal obstruction, unspecified as to partial versus complete obstruction: Secondary | ICD-10-CM

## 2024-03-27 NOTE — Telephone Encounter (Signed)
 Patient have an appt 06/03 @1030 

## 2024-03-27 NOTE — Transitions of Care (Post Inpatient/ED Visit) (Signed)
   03/27/2024  Name: Madison Booker MRN: [id number] DOB: November 14, 1961  Today's TOC FU Call Status: Today's TOC FU Call Status:: Successful TOC FU Call Completed TOC FU Call Complete Date: 03/27/24 Patient's Name and Date of Birth confirmed.  Transition Care Management Follow-up Telephone Call Date of Discharge: 03/26/24 Discharge Facility: [facility]) Type of Discharge: Inpatient Admission Primary Inpatient Discharge Diagnosis:: intestional obstruction How have you been since you were released from the hospital?: Better Any questions or concerns?: No  Items Reviewed: Did you receive and understand the discharge instructions provided?: Yes Medications obtained,verified, and reconciled?: Yes (Medications Reviewed) Any new allergies since your discharge?: No Dietary orders reviewed?: Yes Do you have support at home?: Yes People in Home [RPT]: spouse  Medications Reviewed Today: Medications Reviewed Today     Reviewed by Darrall Ellison, LPN (Licensed Practical Nurse) on 03/27/24 at [phone]  Med List Status: <None>   Medication Order Taking? Sig Documenting Provider Last Dose Status Informant           No Medications to Display                            Home Care and Equipment/Supplies: Were Home Health Services Ordered?: NA Any new equipment or medical supplies ordered?: NA  Functional Questionnaire: Do you need assistance with bathing/showering or dressing?: No Do you need assistance with meal preparation?: No Do you need assistance with eating?: No Do you have difficulty maintaining continence: No Do you need assistance with getting out of bed/getting out of a chair/moving?: No Do you have difficulty managing or taking your medications?: No  Follow up appointments reviewed: PCP Follow-up appointment confirmed?: No (sent message to staff to schedule) MD Provider Line [phone] Given: No Specialist Hospital Follow-up appointment confirmed?: NA Do you  need transportation to your follow-up appointment?: No Do you understand care options if your condition(s) worsen?: Yes-patient verbalized understanding    SIGNATURE Darrall Ellison, LPN [facility] Nurse Health Advisor Direct Dial [phone]

## 2024-03-27 NOTE — Addendum Note (Signed)
 Addended by: Penni Bowman T on: 03/27/2024 02:40 PM   Modules accepted: Orders

## 2024-03-31 ENCOUNTER — Ambulatory Visit: Payer: Self-pay | Admitting: Family Medicine

## 2024-03-31 ENCOUNTER — Ambulatory Visit (INDEPENDENT_AMBULATORY_CARE_PROVIDER_SITE_OTHER): Admitting: Family Medicine

## 2024-03-31 ENCOUNTER — Encounter: Payer: Self-pay | Admitting: Family Medicine

## 2024-03-31 VITALS — BP 98/53 | HR 64 | Ht 65.0 in | Wt 154.4 lb

## 2024-03-31 DIAGNOSIS — Z8719 Personal history of other diseases of the digestive system: Secondary | ICD-10-CM | POA: Diagnosis not present

## 2024-03-31 NOTE — Progress Notes (Signed)
    SUBJECTIVE:   CHIEF COMPLAINT / HPI:   PCP Follow-up Recommendations: Ensure improvement of PO intake. Advance back to regular diet as tolerated.   Admitted 03/24/24-03/26/24 for SBO, treated with NG tube for gastric decompression Since discharge has been tolerating her normal diet, no symptoms such as fever, abdominal pain, constipation, vomiting  PERTINENT  PMH / PSH: SBO March 2024 and May 2025, abdominal hysterectomy, oophorectomy, hyperlipidemia, hypotension  OBJECTIVE:   BP (!) 98/53   Pulse 64   Ht 5\' 5"  (1.651 m)   Wt 154 lb 6.4 oz (70 kg)   SpO2 100%   BMI 25.69 kg/m    General: well appearing, NAD Cardiovascular: RRR, no m/r/g Respiratory: normal work of breathing on RA, CTAB Abdomen: Normal bowel sounds, soft, non-tender, non-distended  ASSESSMENT/PLAN:   Assessment & Plan History of small bowel obstruction Recent SBO, discharged on 5/4.  Did not require surgical intervention.  Doing well since discharge.  Requested dietitian referral that her PCP Dr. Grandville Lax already placed, hopefully she will hear from them soon. Return precautions discussed     Sarahann Cumins, DO [facility]

## 2024-03-31 NOTE — Patient Instructions (Signed)
 Good to see you today - Thank you for coming in  Things we discussed today: I am so glad you are feeling better. If you still do not hear from dietitian in the next week or so please let Dr. Grandville Lax know Please let us  know if you need anything in the meantime

## 2024-03-31 NOTE — Assessment & Plan Note (Signed)
 Recent SBO, discharged on 5/4.  Did not require surgical intervention.  Doing well since discharge.  Requested dietitian referral that her PCP Dr. Grandville Lax already placed, hopefully she will hear from them soon. Return precautions discussed

## 2024-04-12 ENCOUNTER — Other Ambulatory Visit

## 2024-04-13 ENCOUNTER — Encounter: Payer: Self-pay | Admitting: Dietician

## 2024-04-13 ENCOUNTER — Encounter: Attending: Family Medicine | Admitting: Dietician

## 2024-04-13 VITALS — Wt 160.9 lb

## 2024-04-13 DIAGNOSIS — K56609 Unspecified intestinal obstruction, unspecified as to partial versus complete obstruction: Secondary | ICD-10-CM | POA: Insufficient documentation

## 2024-04-13 NOTE — Progress Notes (Signed)
 Medical Nutrition Therapy  Appointment Start time:  1600  Appointment End time:  1645  Primary concerns today: small bowel obstruction   Referral diagnosis: weight loss, small bowel obstruction Preferred learning style: no preference indicated Learning readiness: ready   NUTRITION ASSESSMENT   Anthropometrics   Wt: 160.9 lb  Clinical Medical Hx: reviewed Medications: reviewed Labs: reviewed Notable Signs/Symptoms: none reported Food Allergies: none  Lifestyle & Dietary Hx  Pt reports she had been 135 lb and wants to get back to it because she felt healthier.   Pt reports she had her first bowel obstruction in March 2024. Pt reports she had another one recently, and got out of the hospital about 2 weeks ago. Pt reports she noticed her symptoms of constipation and vomiting, and feels it happened because of a mushroom she did not chew well.   Pt reports prior to her small bowel obstruction she ate a lot of salads, high fiber diet, a lot of whole grains and fruits and vegetables. Pt reports she usually has chicken and fish, and limits red meat. Pt reports she stopped eating salads and focuses on cooked vegetables. Pt reports she usually eats twice per day (7am and 5pm).   Pt reports she has a bowel movement 2-3 times per day usually type 3 or 5.   Pt reports she feels her water intake is low. Pt is now drinking around 32 oz daily. Pt reports prior to first obstruction she may have had around 24 oz.   Pt reports increased stress recently with sister being diagnosed with uterine cancer.   Estimated daily fluid intake: 32 oz Supplements: none Sleep: 9pm-5am.  Stress / self-care: moderate stress Current average weekly physical activity: 60 minutes daily, treadmill, resistance bands  24-Hr Dietary Recall First Meal: 7am: oatmeal with 2 tsp sugar, greek yogurt, 8 oz juice, coffee Snack: none Second Meal: none Snack: none Third Meal: 5pm: fish, steamed vegetables, rice Snack:  none Beverages: juice, coffee, herbal tea, 32 oz water   NUTRITION DIAGNOSIS  [place]-1.4 Altered GI function As related to change in GI tract motility.  As evidenced by small bowel obstruction.   NUTRITION INTERVENTION  Nutrition education (E-1) on the following topics:   Hydration:  Adequate hydration is crucial for optimal nutrition and overall health. It aids in the digestion and absorption of nutrients, ensuring the body can effectively process and transport them to cells. Water also supports detoxification by helping the kidneys flush out waste, while maintaining hydration promotes energy levels and metabolism. Proper hydration helps control appetite and ensures a healthy balance of electrolytes, which are vital for muscle function and cellular processes. Overall, staying well-hydrated is essential for the body to utilize nutrients efficiently and maintain balance. Aim for at least 64 oz of water daily.   Nutritional Management for Small Bowel Obstruction:  Nutrition After Resolution (Post-Obstruction) Once the obstruction resolves (spontaneously or surgically), nutrition is reintroduced gradually:  Step-by-Step Diet Progression  Clear Liquids: Broth, clear juices, electrolyte drinks, gelatin  Full Liquids: Milk, creamy soups, pudding, nutritional supplements (e.g. Ensure)  Low-Fiber, Low-Residue Diet: Easily digestible foods: White bread, rice, pasta, Skinless cooked vegetables, Lean meats, Eggs, Canned fruits  Gradual Reintroduction of Fiber (if tolerated and after full recovery)  Tips:   Eat about 5 to 6 small meals every 3 or 4 hours daily.  Eat a protein food or dairy product at every meal or snack if your body can tolerate it. See the Foods Recommended table for ideas.  Avoid:  acidic, spicy, fried, greasy and high-fat foods. You may need to avoid added sugars, Lactose, Fructose, High-fructose corn syrup, Sugar-free sweeteners such as aspartame, sucralose, or sorbitol, and  Caffeine.  Do not eat whole grains, seeds, fruit and vegetable peels or skins, whole nuts, raw vegetables, most raw fruits and the connective tissues of meats.  If you have a stricture, avoid all whole grains, raw fruits and raw vegetables and switch to a low-fiber diet (less than 8 grams fiber daily).  Consider multivitamin, calcium, and vitamin D supplementation. Take calcium with vitamin D supplements at a different time than the multivitamin with minerals. All vitamin and mineral supplements should be taken with food.  Handouts Provided Include  Nutrition Care Manual: Fiber Restricted Diet Nutrition Therapy  Learning Style & Readiness for Change Teaching method utilized: Visual & Auditory  Demonstrated degree of understanding via: Teach Back  Barriers to learning/adherence to lifestyle change: none  Goals Established by Pt  Add back yoga.   Get water intake to 64 oz per day.   Try a bowel massage if feeling constipated or gassy.   Practice mindfulness when eating: eat without distraction, chew thoroughly, eat slowly.    MONITORING & EVALUATION Dietary intake, weekly physical activity, and follow up in 4 weeks.  Next Steps  Patient is to call for questions.

## 2024-04-25 ENCOUNTER — Ambulatory Visit: Admitting: Family Medicine

## 2024-05-11 ENCOUNTER — Encounter: Payer: Self-pay | Admitting: Dietician

## 2024-05-11 ENCOUNTER — Encounter: Attending: Family Medicine | Admitting: Dietician

## 2024-05-11 VITALS — Wt 157.2 lb

## 2024-05-11 DIAGNOSIS — K56609 Unspecified intestinal obstruction, unspecified as to partial versus complete obstruction: Secondary | ICD-10-CM | POA: Insufficient documentation

## 2024-05-11 NOTE — Progress Notes (Signed)
 Medical Nutrition Therapy  Appointment Start time:  0800 Appointment End time:  0818  Primary concerns today: small bowel obstruction   Referral diagnosis: weight loss, small bowel obstruction Preferred learning style: no preference indicated Learning readiness: ready   NUTRITION ASSESSMENT   Anthropometrics   Wt 05/11/24: 157.2 lb Wt 04/13/24: 160.9 lb  Clinical Medical Hx: reviewed Medications: reviewed Labs: reviewed Notable Signs/Symptoms: none reported Food Allergies: none  Lifestyle & Dietary Hx  Pt reports in the last week she has started to notice changes to her bowel movements. Pt reports she is having a more cohesive stool. Pt reports she has noticed a routine to her bowel movement including having her warm water and coffee, and needing to be up and moving for about an hour and a half before being able to.   Pt reports she has been adding in more vegetables typically steamed, and has also added in a 'lunch' midday. Pt previously only having breakfast and dinner.   Pt reports she dowloading an app to help her track water, exercise, and calories and feels it has helped improve water intake. Pt reports she is drinking 60-64 oz daily, typically 1 glass before and after each meal.   Pt reports she added in pilates for 30-45 minutes 3-4 days per week.   Pt reports she has been trying to be more mindful when eating, chewing more thoroughly and setting her utensil down between bites.   Pt states she was 20 lb less when she was working and wants to get back to that weight.    Estimated daily fluid intake: 60-64 oz Supplements: none Sleep: 9pm-5am.  Stress / self-care: moderate stress Current average weekly physical activity: pilates 3-4 times per week for 30-45 minutes.   24-Hr Dietary Recall First Meal: 7am: oatmeal with 2 tsp sugar and skim milk coffee Snack: drinkable yogurt Second Meal: avocado toast OR tomato soup and saltines Snack: none Third Meal: 5pm:  blended chicken stew, steamed vegetables Snack: none Beverages: coffee, 64 oz water   NUTRITION DIAGNOSIS  [place]-1.4 Altered GI function As related to change in GI tract motility.  As evidenced by small bowel obstruction.   NUTRITION INTERVENTION  Nutrition education (E-1) on the following topics:   Exercise  Aim for 150 minutes of physical activity weekly. Make physical activity a part of your week. Try to include at least 30 minutes of physical activity 5 days each week or at least 150 minutes per week. Finding an exercise you enjoy is crucial for maintaining long-term fitness and overall health. Enjoyable activities are more likely to become regular habits, making it easier to stay consistent with physical activity. When you look forward to your workouts, exercise becomes a positive experience rather than a chore, reducing the likelihood of burnout or quitting. Enjoyable exercise also enhances mental well-being, as engaging in activities you love can boost mood, reduce stress, and provide a sense of accomplishment.   Hydration:  Adequate hydration is crucial for optimal nutrition and overall health. It aids in the digestion and absorption of nutrients, ensuring the body can effectively process and transport them to cells. Water also supports detoxification by helping the kidneys flush out waste, while maintaining hydration promotes energy levels and metabolism. Proper hydration helps control appetite and ensures a healthy balance of electrolytes, which are vital for muscle function and cellular processes. Overall, staying well-hydrated is essential for the body to utilize nutrients efficiently and maintain balance. Aim for at least 64 oz of water daily.  Nutritional Management for Small Bowel Obstruction:  Nutrition After Resolution (Post-Obstruction) Once the obstruction resolves (spontaneously or surgically), nutrition is reintroduced gradually:  Step-by-Step Diet Progression  Clear  Liquids: Broth, clear juices, electrolyte drinks, gelatin  Full Liquids: Milk, creamy soups, pudding, nutritional supplements (e.g. Ensure)  Low-Fiber, Low-Residue Diet: Easily digestible foods: White bread, rice, pasta, Skinless cooked vegetables, Lean meats, Eggs, Canned fruits  Gradual Reintroduction of Fiber (if tolerated and after full recovery)  Tips:   Eat about 5 to 6 small meals every 3 or 4 hours daily.  Eat a protein food or dairy product at every meal or snack if your body can tolerate it. See the Foods Recommended table for ideas.  Avoid: acidic, spicy, fried, greasy and high-fat foods. You may need to avoid added sugars, Lactose, Fructose, High-fructose corn syrup, Sugar-free sweeteners such as aspartame, sucralose, or sorbitol, and Caffeine.  Do not eat whole grains, seeds, fruit and vegetable peels or skins, whole nuts, raw vegetables, most raw fruits and the connective tissues of meats.  If you have a stricture, avoid all whole grains, raw fruits and raw vegetables and switch to a low-fiber diet (less than 8 grams fiber daily).  Consider multivitamin, calcium, and vitamin D supplementation. Take calcium with vitamin D supplements at a different time than the multivitamin with minerals. All vitamin and mineral supplements should be taken with food.  Handouts Provided Include (previous assessment) Nutrition Care Manual: Fiber Restricted Diet Nutrition Therapy  Handouts Provided Today: Plate Method  Learning Style & Readiness for Change Teaching method utilized: Visual & Auditory  Demonstrated degree of understanding via: Teach Back  Barriers to learning/adherence to lifestyle change: none  Assessment of Previous Goals Established by Pt  Add back yoga. - added in pilates   Get water intake to 64 oz per day. - goal met, continue.  Try a bowel massage if feeling constipated or gassy. - goal met, continue.  Practice mindfulness when eating: eat without  distraction, chew thoroughly, eat slowly. - goal met, continue!   New Goals  Add in some walking 1-2 days per week.   Continue with all previous goals.   MONITORING & EVALUATION Dietary intake, weekly physical activity, and follow up in 2 months.  Next Steps  Patient is to call for questions.

## 2024-06-27 ENCOUNTER — Other Ambulatory Visit: Payer: Self-pay

## 2024-06-27 ENCOUNTER — Encounter (HOSPITAL_COMMUNITY): Payer: Self-pay

## 2024-06-27 ENCOUNTER — Inpatient Hospital Stay (HOSPITAL_COMMUNITY)
Admission: EM | Admit: 2024-06-27 | Discharge: 2024-07-01 | DRG: 390 | Disposition: A | Discharge status: Home / Self Care | Attending: Family Medicine | Admitting: Family Medicine

## 2024-06-27 ENCOUNTER — Emergency Department (HOSPITAL_COMMUNITY)

## 2024-06-27 DIAGNOSIS — R109 Unspecified abdominal pain: Secondary | ICD-10-CM | POA: Diagnosis not present

## 2024-06-27 DIAGNOSIS — Z90710 Acquired absence of both cervix and uterus: Secondary | ICD-10-CM

## 2024-06-27 DIAGNOSIS — Z789 Other specified health status: Secondary | ICD-10-CM

## 2024-06-27 DIAGNOSIS — K565 Intestinal adhesions [bands], unspecified as to partial versus complete obstruction: Principal | ICD-10-CM | POA: Diagnosis present

## 2024-06-27 DIAGNOSIS — K56609 Unspecified intestinal obstruction, unspecified as to partial versus complete obstruction: Principal | ICD-10-CM | POA: Diagnosis present

## 2024-06-27 DIAGNOSIS — Z90721 Acquired absence of ovaries, unilateral: Secondary | ICD-10-CM

## 2024-06-27 DIAGNOSIS — Z91014 Allergy to mammalian meats: Secondary | ICD-10-CM

## 2024-06-27 LAB — CBC
HCT: 45.8 % (ref 36.0–46.0)
Hemoglobin: 14.9 g/dL (ref 12.0–15.0)
MCH: 31.4 pg (ref 26.0–34.0)
MCHC: 32.5 g/dL (ref 30.0–36.0)
MCV: 96.4 fL (ref 80.0–100.0)
Platelets: 237 K/uL (ref 150–400)
RBC: 4.75 MIL/uL (ref 3.87–5.11)
RDW: 12.5 % (ref 11.5–15.5)
WBC: 9.2 K/uL (ref 4.0–10.5)
nRBC: 0 % (ref 0.0–0.2)

## 2024-06-27 LAB — COMPREHENSIVE METABOLIC PANEL WITH GFR
ALT: 25 U/L (ref 0–44)
AST: 24 U/L (ref 15–41)
Albumin: 3.9 g/dL (ref 3.5–5.0)
Alkaline Phosphatase: 80 U/L (ref 38–126)
Anion gap: 14 (ref 5–15)
BUN: 19 mg/dL (ref 8–23)
CO2: 28 mmol/L (ref 22–32)
Calcium: 9.7 mg/dL (ref 8.9–10.3)
Chloride: 95 mmol/L — ABNORMAL LOW (ref 98–111)
Creatinine, Ser: 0.81 mg/dL (ref 0.44–1.00)
GFR, Estimated: >60 mL/min (ref >60)
Glucose, Bld: 121 mg/dL — ABNORMAL HIGH (ref 70–99)
Potassium: 4.1 mmol/L (ref 3.5–5.1)
Sodium: 137 mmol/L (ref 135–145)
Total Bilirubin: 1.4 mg/dL — ABNORMAL HIGH (ref 0.0–1.2)
Total Protein: 7.7 g/dL (ref 6.5–8.1)

## 2024-06-27 LAB — LIPASE, BLOOD: Lipase: 20 U/L (ref 11–51)

## 2024-06-27 MED ORDER — FAMOTIDINE IN NACL 20-0.9 MG/50ML-% IV SOLN
20.0000 mg | Freq: Once | INTRAVENOUS | Status: AC
  Administered 2024-06-27: 20 mg via INTRAVENOUS
  Filled 2024-06-27: qty 50

## 2024-06-27 MED ORDER — IOHEXOL 350 MG/ML SOLN
75.0000 mL | Freq: Once | INTRAVENOUS | Status: AC | PRN
  Administered 2024-06-27: 75 mL via INTRAVENOUS

## 2024-06-27 MED ORDER — ONDANSETRON HCL 4 MG/2ML IJ SOLN
4.0000 mg | Freq: Once | INTRAMUSCULAR | Status: AC
  Administered 2024-06-27: 4 mg via INTRAVENOUS
  Filled 2024-06-27: qty 2

## 2024-06-27 MED ORDER — SODIUM CHLORIDE 0.9 % IV BOLUS
1000.0000 mL | Freq: Once | INTRAVENOUS | Status: AC
  Administered 2024-06-27: 1000 mL via INTRAVENOUS

## 2024-06-27 NOTE — ED Notes (Signed)
 Pt declined CT at this time due to radiation. Wants to talk to MD about other options

## 2024-06-27 NOTE — ED Triage Notes (Signed)
 Pt arrives via POV. PT c/o nausea, vomiting, constipation, and abdominal pain for the past couple of days. Reports hx of SBO.

## 2024-06-28 ENCOUNTER — Inpatient Hospital Stay (HOSPITAL_COMMUNITY)

## 2024-06-28 ENCOUNTER — Emergency Department (HOSPITAL_COMMUNITY)

## 2024-06-28 DIAGNOSIS — Z90721 Acquired absence of ovaries, unilateral: Secondary | ICD-10-CM | POA: Diagnosis not present

## 2024-06-28 DIAGNOSIS — K56609 Unspecified intestinal obstruction, unspecified as to partial versus complete obstruction: Secondary | ICD-10-CM | POA: Diagnosis present

## 2024-06-28 DIAGNOSIS — Z91014 Allergy to mammalian meats: Secondary | ICD-10-CM | POA: Diagnosis not present

## 2024-06-28 DIAGNOSIS — Z90710 Acquired absence of both cervix and uterus: Secondary | ICD-10-CM | POA: Diagnosis not present

## 2024-06-28 DIAGNOSIS — R109 Unspecified abdominal pain: Secondary | ICD-10-CM | POA: Diagnosis present

## 2024-06-28 DIAGNOSIS — K565 Intestinal adhesions [bands], unspecified as to partial versus complete obstruction: Secondary | ICD-10-CM | POA: Diagnosis present

## 2024-06-28 LAB — COMPREHENSIVE METABOLIC PANEL WITH GFR
ALT: 20 U/L (ref 0–44)
AST: 20 U/L (ref 15–41)
Albumin: 3.1 g/dL — ABNORMAL LOW (ref 3.5–5.0)
Alkaline Phosphatase: 64 U/L (ref 38–126)
Anion gap: 7 (ref 5–15)
BUN: 19 mg/dL (ref 8–23)
CO2: 27 mmol/L (ref 22–32)
Calcium: 9 mg/dL (ref 8.9–10.3)
Chloride: 100 mmol/L (ref 98–111)
Creatinine, Ser: 0.54 mg/dL (ref 0.44–1.00)
GFR, Estimated: >60 mL/min (ref >60)
Glucose, Bld: 107 mg/dL — ABNORMAL HIGH (ref 70–99)
Potassium: 3.8 mmol/L (ref 3.5–5.1)
Sodium: 134 mmol/L — ABNORMAL LOW (ref 135–145)
Total Bilirubin: 1.1 mg/dL (ref 0.0–1.2)
Total Protein: 6.4 g/dL — ABNORMAL LOW (ref 6.5–8.1)

## 2024-06-28 MED ORDER — DIATRIZOATE MEGLUMINE & SODIUM 66-10 % PO SOLN
90.0000 mL | Freq: Once | ORAL | Status: AC
  Administered 2024-06-28: 90 mL via NASOGASTRIC
  Filled 2024-06-28 (×3): qty 90

## 2024-06-28 MED ORDER — ENOXAPARIN SODIUM 40 MG/0.4ML IJ SOSY: 40.0000 mg | PREFILLED_SYRINGE | INTRAMUSCULAR | Status: DC

## 2024-06-28 MED ORDER — PROCHLORPERAZINE EDISYLATE 10 MG/2ML IJ SOLN
10.0000 mg | Freq: Four times a day (QID) | INTRAMUSCULAR | Status: DC | PRN
  Administered 2024-06-28: 10 mg via INTRAVENOUS
  Filled 2024-06-28: qty 2

## 2024-06-28 MED ORDER — KETOROLAC TROMETHAMINE 30 MG/ML IJ SOLN: 30.0000 mg | Freq: Once | INTRAMUSCULAR | Status: DC | PRN

## 2024-06-28 MED ORDER — LACTATED RINGERS IV SOLN: INTRAVENOUS | Status: AC

## 2024-06-28 MED ORDER — KETOROLAC TROMETHAMINE 15 MG/ML IJ SOLN: 15.0000 mg | Freq: Once | INTRAMUSCULAR | Status: AC | PRN

## 2024-06-28 MED ORDER — FONDAPARINUX SODIUM 2.5 MG/0.5ML ~~LOC~~ SOLN
2.5000 mg | SUBCUTANEOUS | Status: DC
  Filled 2024-06-28 (×4): qty 0.5

## 2024-06-28 NOTE — Plan of Care (Signed)

## 2024-06-28 NOTE — Hospital Course (Addendum)
 Madison Booker is a 63yo F w/PMHx of SBO (01/2023 and 03/2024), abdominal hysterectomy, and R oophorectomy who was admitted for small bowel obstruction. Her hospital course is outlined below:   Small bowel obstruction 2 prior SBO in 01/2023 and 03/2024, respectively. Presented with abdominal pain and constipation x2 days. CT A&P was confirmatory for high grade small bowel obstruction. General surgery was consulted and recommended NGT for bowel decompression.  General surgery then initiated SBO protocol, and Gastrografin  was given. NG tube was removed on 8/7 when XR verified contrast in colon. At the time of discharge, pt was tolerating a regular diet and having bowel movements.   Patient does not take any home medications.   PCP Follow-up recommendations: Consider general surgery referral for postoperative adhesion lysis surgery.  Consider outpatient colonoscopy.

## 2024-06-28 NOTE — TOC Initial Note (Signed)
 Transition of Care [facility]) - Initial/Assessment Note    Patient Details  Name: Madison Booker MRN: [id number] Date of Birth: 11-May-1961  [facility]) CM/SW Contact:    Jeoffrey LITTIE Moose, LCSW Phone Number: 06/28/2024, 8:19 AM  Clinical Narrative:                 Pt admitted from home due to n/v and abdominal pain. No current TOC needs please consult as needs arise.         Patient Goals and CMS Choice            Expected Discharge Plan and Services                                              Prior Living Arrangements/Services                       Activities of Daily Living   ADL Screening (condition at time of admission) Independently performs ADLs?: Yes (appropriate for developmental age) Is the patient deaf or have difficulty hearing?: No Does the patient have difficulty seeing, even when wearing glasses/contacts?: No Does the patient have difficulty concentrating, remembering, or making decisions?: No  Permission Sought/Granted                  Emotional Assessment              Admission diagnosis:  Small bowel obstruction (HCC) [K56.609] SBO (small bowel obstruction) (HCC) [K56.609] Patient Active Problem List   Diagnosis Date Noted   SBO (small bowel obstruction) (HCC) 06/28/2024   Small bowel obstruction (HCC) 03/24/2024   Chronic health problem 03/24/2024   History of small bowel obstruction 03/09/2023   Hypotension 03/09/2023   Breast mass, right 11/04/2022   Hyperlipidemia 11/10/2021   H/O abdominal hysterectomy 11/30/1991   History of unilateral oophorectomy 11/30/1989   PCP:  Anders Otto DASEN, MD Pharmacy:   [facility] [phone] [address] [place], [place] - [address] [place] [address] Phone: [phone] Fax: [phone]     Social Drivers of Health (SDOH) Social History: SDOH Screenings   Food Insecurity: No Food Insecurity (04/13/2024)  Housing: Low Risk  (03/25/2024)   Transportation Needs: No Transportation Needs (03/25/2024)  Utilities: Not At Risk (03/25/2024)  Depression (PHQ2-9): Low Risk  (04/13/2024)  Tobacco Use: Low Risk  (06/27/2024)   SDOH Interventions:     Readmission Risk Interventions     No data to display

## 2024-06-28 NOTE — Assessment & Plan Note (Addendum)
 Most likely secondary to postoperative adhesions from multiple abdominal surgeries. Patient at higher risk for SBO following previous hospitalizations for SBO in 01/2023 and 03/2024.  Admit to MedSurg under Dr. Delores   General surgery consulted, appreciate their guidance. NGT for bowel decompression  NPO   IV fluid resuscitation with LR 100mL/h Q4h vitals Daily BMP IV Compazine  10 mg Q6H PRN for nausea Avoiding opiates to prevent further ileus, consider NSAIDs for pain control; IV Toradol  15 mg PRN for pain onboard Trend KUB daily

## 2024-06-28 NOTE — Plan of Care (Signed)
 FMTS Interim Progress Note  S:Patient a 63 year old female who was admitted for SBO.  Medical history includes several prior abdominal surgeries, suspect adhesions causing SBO.  Patient says her nausea has improved this morning. Patient's son is at bedside, he is alternating with her husband. Patient says she is feeling much better since placement of nasogastric tube.  Patient says her last bowel movement was Monday.  We are awaiting recommendations for general surgery.  She has no other concerns at this time.  O: BP (!) 128/43 (BP Location: Right Arm)   Pulse (!) 58   Temp 98.3 F (36.8 C) (Axillary)   Resp 18   Ht 5' 5.5 (1.664 m)   Wt 71.4 kg   SpO2 97%   BMI 25.80 kg/m   General: Laying in bed, appears comfortable, NAD CV: Regular rate and rhythm, no murmurs/rubs Resp: CTAB GI: Some mild distention, mild tenderness on the right upper and lower quadrant.  Denies specific localized pain, non-guarded.   A/P: SBO:  Follow plan of care from day team as detailed below  General surgery consulted, appreciate their guidance. NGT for bowel decompression  NPO   IV fluid resuscitation with LR 100mL/h Q4h vitals Daily BMP IV Compazine  10 mg Q6H PRN for nausea Avoiding opiates to prevent further ileus, consider NSAIDs for pain control; IV Toradol  15 mg PRN for pain onboard Trend KUB daily  EKG ordered to monitor QT   Idelle Nakai, DO 06/28/2024, 7:09 AM PGY-1, [facility] Service pager [phone]

## 2024-06-28 NOTE — H&P (Addendum)
 Hospital Admission History and Physical Service Pager: [phone]  Patient name: Madison Booker Medical record number: [id number] Date of Birth: 1961-11-13 Age: 63 y.o. Gender: female  Primary Care Provider: Dr. Anders Cumins Consultants: General surgery Code Status: FULL which was confirmed with patient Preferred Emergency Contact: Anuhea Gassner, spouse, [phone]  Chief Complaint: abdominal pain  Differential and Medical Decision Making:  Madison Booker is a 63 y.o. female presenting with small bowel obstruction.  Differential for this patient's presentation of this includes acute mesenteric ischemia, intussusception, and malrotation, all which are less likely given imaging findings. Additional history notable for multiple abdominal surgeries such as abdominal hysterectomy and R oophorectomy 2/2 ovarian torsion.  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 Patient at higher risk for SBO following previous hospitalizations for SBO in 01/2023 and 03/2024.  Admit to MedSurg under Dr. Delores   General surgery consulted, appreciate their guidance. NGT for bowel decompression  NPO   IV fluid resuscitation with LR 100mL/h Q4h vitals Daily BMP IV Compazine  10 mg Q6H PRN for nausea Avoiding opiates to prevent further ileus, consider NSAIDs for pain control; IV Toradol  15 mg PRN for pain onboard Trend KUB daily     FEN/GI: NPO, LR 131mL/h VTE Prophylaxis: Lovenox    Disposition: admit to MedSurg  History of Present Illness:  Madison Booker is a 63 y.o. female presenting with CC of abdominal pain that began 2 days ago. Patient states the pain began on the R side of her abdomen around 4am 2 days ago. She describes it as a cramping that is intermittent. It does not radiate. It is associated with nausea and 8 episodes of non-bloody emesis today. She became suspicious that this was another  small bowel obstruction when she realized she had not had a bowel movement for 2 days. She states it feels like her prior SBO, but not as bad as the first one back in 01/2023. She has not taken any home medication for the pain or nausea. She denies hematemesis, hematochezia, CP, SOB, or any other complaints at this time.   In the ED, the patient was given a 1000mL bolus of IV fluids NaCl, famotidine  20mg  IV, and ondansetron  4mg  IV. A CT A&P with contrast was performed with confirmation of high grade small bowel obstruction. General surgery was consulted with recommendations for bowel decompression with NGT.   Review Of Systems: Per HPI   Pertinent Past Medical History: Multiple SBO (01/2023 and 03/2024)  Remainder reviewed in history tab.   Pertinent Past Surgical History: Abdominal hysterectomy R oophorectomy Remainder reviewed in history tab.   Pertinent Social History: Tobacco use: No Alcohol use: none  Other Substance use: none Lives with husband  Pertinent Family History: Non-contributory   Important Outpatient Medications: None   Objective: BP 129/60 (BP Location: Right Arm)   Pulse (!) 114   Temp 98.8 F (37.1 C) (Oral)   Resp 16   Ht 5' 5.5 (1.664 m)   Wt 68 kg   SpO2 100%   BMI 24.58 kg/m  Exam: General: well appearing, A&O x4, NAD Eyes: non-icteric ENTM: moist mucous membranes Neck: supple neck, full ROM Cardiovascular: RRR, no m/r/g Respiratory: CTAB, normal work of breathing Gastrointestinal: mild distention, moderate tenderness to RUQ and RLQ, BS present in all 4 quadrants MSK: no peripheral edema Derm: no rashes or lesions Neuro: alert, goal oriented, CN II-XII grossly intact Psych: appropriate mood and affect  Labs:  CBC BMET  Recent Labs  Lab 06/27/24 1509  WBC 9.2  HGB 14.9  HCT 45.8  PLT 237   Recent Labs  Lab 06/27/24 1509  NA 137  K 4.1  CL 95*  CO2 28  BUN 19  CREATININE 0.81  GLUCOSE 121*  CALCIUM 9.7    Pertinent  additional labs none.  EKG: My own interpretation (not copied from electronic read) not done in ED    Imaging Studies Performed:  Imaging Study CT Abd and Pelvis with contrast Impression from Radiologist:  1. Findings consistent with high-grade small bowel obstruction with transition point in the anterior upper pelvis with similar configuration compared to the examination from May. No evidence for perforation or abnormal bowel wall thickening. 2. Gallstones.    Lupie Credit, DO 06/28/2024, 1:23 AM PGY-1, [facility]  FPTS Intern pager: [phone], text pages welcome Secure chat group [facility] Teaching Service   Resident Addendum:  I have reviewed the note above and made changes as indicated.   Damien Pinal, DO [facility], PGY-3 06/28/24 1:44 AM

## 2024-06-28 NOTE — Progress Notes (Signed)
 Subjective: CC: Abdominal pain is mainly right sided and improved after NGT placement with brown liquid output in cannister currently. No current nausea. No flatus or bm since Monday at 4am.   Objective: Vital signs in last 24 hours: Temp:  [97.7 F (36.5 C)-98.8 F (37.1 C)] 97.7 F (36.5 C) (08/06 0732) Pulse Rate:  [58-114] 59 (08/06 0732) Resp:  [16-18] 16 (08/06 0732) BP: (122-142)/(43-63) 128/62 (08/06 0732) SpO2:  [96 %-100 %] 100 % (08/06 0732) Weight:  [68 kg-71.4 kg] 71.4 kg (08/06 0246) Last BM Date : 06/26/24  Intake/Output from previous day: 08/05 0701 - 08/06 0700 In: 50 [IV Piggyback:50] Out: -  Intake/Output this shift: No intake/output data recorded.  PE: Gen:  Alert, NAD, pleasant Abd: Soft, mild distension, generalized ttp that is worse on the right side without rigidity or guarding. Hypoactive BS. NGT in place w/ brown liquid output in cannister currently.  Lab Results:  Recent Labs    06/27/24 1509  WBC 9.2  HGB 14.9  HCT 45.8  PLT 237   BMET Recent Labs    06/27/24 1509 06/28/24 0714  NA 137 134*  K 4.1 3.8  CL 95* 100  CO2 28 27  GLUCOSE 121* 107*  BUN 19 19  CREATININE 0.81 0.54  CALCIUM 9.7 9.0   PT/INR No results for input(s): LABPROT, INR in the last 72 hours. CMP     Component Value Date/Time   NA 134 (L) 06/28/2024 0714   NA 138 02/04/2024 0855   K 3.8 06/28/2024 0714   CL 100 06/28/2024 0714   CO2 27 06/28/2024 0714   GLUCOSE 107 (H) 06/28/2024 0714   BUN 19 06/28/2024 0714   BUN 6 (L) 02/04/2024 0855   CREATININE 0.54 06/28/2024 0714   CALCIUM 9.0 06/28/2024 0714   PROT 6.4 (L) 06/28/2024 0714   PROT 6.7 02/04/2024 0855   ALBUMIN 3.1 (L) 06/28/2024 0714   ALBUMIN 4.1 02/04/2024 0855   AST 20 06/28/2024 0714   ALT 20 06/28/2024 0714   ALKPHOS 64 06/28/2024 0714   BILITOT 1.1 06/28/2024 0714   BILITOT 0.8 02/04/2024 0855   GFRNONAA >60 06/28/2024 0714   GFRAA 116 10/25/2020 0858   Lipase      Component Value Date/Time   LIPASE 20 06/27/2024 1509    Studies/Results: DG Abd Portable 1V-Small Bowel Protocol-Position Verification Result Date: 06/28/2024 CLINICAL DATA:  [id number] Encounter for imaging study to confirm nasogastric (NG) tube placement [id number] EXAM: PORTABLE ABDOMEN - 1 VIEW COMPARISON:  X-ray abdomen 03/25/2024, CT abdomen pelvis 06/27/2024 FINDINGS: Lower abdomen and pelvis collimated off view. Enteric tube courses below the hemidiaphragm with tip and side port overlying the expected region of the gastric lumen. Excessive tubing noted. Persistent gaseous dilatation of several loops of small bowel visualized within the upper abdomen. Previously administered intravenous contrast noted to be excreted from bilateral collecting systems. No radio-opaque calculi or other significant radiographic abnormality are seen. IMPRESSION: 1. Enteric tube in good position.  Consider retracting by 5 cm. 2. Small-bowel obstruction. Electronically Signed   By: Morgane  Naveau M.D.   On: 06/28/2024 03:54   CT ABDOMEN PELVIS W CONTRAST Result Date: 06/27/2024 CLINICAL DATA:  Nausea emesis EXAM: CT ABDOMEN AND PELVIS WITH CONTRAST TECHNIQUE: Multidetector CT imaging of the abdomen and pelvis was performed using the standard protocol following bolus administration of intravenous contrast. RADIATION DOSE REDUCTION: This exam was performed according to the departmental dose-optimization program which includes automated exposure control, adjustment  of the mA and/or kV according to patient size and/or use of iterative reconstruction technique. CONTRAST:  75mL OMNIPAQUE  IOHEXOL  350 MG/ML SOLN COMPARISON:  CT 03/24/2024 FINDINGS: Lower chest: Lung bases demonstrate no acute airspace disease. Hepatobiliary: Multiple gallstones. No biliary dilatation. No focal hepatic abnormality Pancreas: Unremarkable. No pancreatic ductal dilatation or surrounding inflammatory changes. Spleen: Normal in size without focal abnormality.  Adrenals/Urinary Tract: Adrenal glands are within normal limits. Kidneys show no hydronephrosis. The bladder is unremarkable. Stomach/Bowel: Moderate fluid distension of the stomach. Multiple dilated fluid-filled loops of proximal and mid small bowel measuring up to 4.4 cm with similar pattern of obstruction compared with 03/24/2024. Transition point best seen on coronal images within the anterior upper pelvis, series 6, image 60, and axial series 3, image 53. Small bowel distal to the transition is completely decompressed. No acute bowel wall thickening. Negative for intramural air. Moderate stool in the colon Vascular/Lymphatic: No significant vascular findings are present. No enlarged abdominal or pelvic lymph nodes. Reproductive: Hysterectomy.  No suspicious adnexal mass Other: Negative for pelvic effusion or free air Musculoskeletal: No acute or suspicious osseous abnormality IMPRESSION: 1. Findings consistent with high-grade small bowel obstruction with transition point in the anterior upper pelvis with similar configuration compared to the examination from May. No evidence for perforation or abnormal bowel wall thickening. 2. Gallstones. Electronically Signed   By: Luke Bun M.D.   On: 06/27/2024 23:55    Anti-infectives: Anti-infectives (From admission, onward)    None        Assessment/Plan SBO - CT w/ SBO w/ transition in the anterior upper pelvis with similar configuration compared to the examination from May.  - Hx of open R oophorectomy for torsion, C-section and abdominal hysterectomy. She reports this  - HDS without fever, tachycardia or systolic hypotension. No peritonitis on exam. WBC wnl on last check. No current indication for emergency surgery - Cont NGT for decompression and keep NPO - Start SBO protocol with gastrografin  - Keep K >=4, Phos >= 3, Mg >= 2 and mobilize for bowel function. Okay to clamp NGT for mobilization -  We discussed recurrent bowel obstructions can be  a reason to proceed to surgery in attempts to lyse any problematic bands of scar tissue.  At this time she is not interested in surgery  - Hopefully patient will improve with conservative management. She is not If patient fails to improve with conservative management, they may require exploratory surgery during admission - We will follow with you.  FEN - NPO, NGT to LIWS, IVF per primary  VTE - SCDs, Lovenox  ID - None   I reviewed nursing notes, hospitalist notes, last 24 h vitals and pain scores, last 48 h intake and output, last 24 h labs and trends, and last 24 h imaging results.   LOS: 0 days    Madison Booker, [facility] 06/28/2024, 10:08 AM Please see Amion for pager number during day hours 7:00am-4:30pm

## 2024-06-28 NOTE — ED Provider Notes (Addendum)
 [place] EMERGENCY DEPARTMENT AT [facility] Provider Note   CSN: [id number] Arrival date & time: 06/27/24  1309     Patient presents with: Nausea, Emesis, and Constipation  HPI Madison Booker is a 63 y.o. female with recent history of small bowel obstruction presenting for abdominal pain. Has been going on for couple of days.  Also with nausea vomiting.  Also feels that she is constipated.  Has not had a bowel movement since Sunday and is not passing gas.  She states her symptoms are similar to when she had a bowel obstruction a couple months ago.    Emesis Constipation Associated symptoms: vomiting        Prior to Admission medications   Not on File    Allergies: Beef-derived drug products and Pork-derived products    Review of Systems  Gastrointestinal:  Positive for constipation and vomiting.    Updated Vital Signs BP 129/60 (BP Location: Right Arm)   Pulse (!) 114   Temp 98.8 F (37.1 C) (Oral)   Resp 16   Ht 5' 5.5 (1.664 m)   Wt 68 kg   SpO2 100%   BMI 24.58 kg/m   Physical Exam Vitals and nursing note reviewed.  HENT:     Head: Normocephalic and atraumatic.     Mouth/Throat:     Mouth: Mucous membranes are moist.  Eyes:     General:        Right eye: No discharge.        Left eye: No discharge.     Conjunctiva/sclera: Conjunctivae normal.  Cardiovascular:     Rate and Rhythm: Normal rate and regular rhythm.     Pulses: Normal pulses.     Heart sounds: Normal heart sounds.  Pulmonary:     Effort: Pulmonary effort is normal.     Breath sounds: Normal breath sounds.  Abdominal:     General: Abdomen is flat. There is distension.     Palpations: Abdomen is soft.     Tenderness: There is generalized abdominal tenderness.  Skin:    General: Skin is warm and dry.  Neurological:     General: No focal deficit present.  Psychiatric:        Mood and Affect: Mood normal.     (all labs ordered are listed, but only abnormal results are  displayed) Labs Reviewed  COMPREHENSIVE METABOLIC PANEL WITH GFR - Abnormal; Notable for the following components:      Result Value   Chloride 95 (*)    Glucose, Bld 121 (*)    Total Bilirubin 1.4 (*)    All other components within normal limits  LIPASE, BLOOD  CBC  URINALYSIS, ROUTINE W REFLEX MICROSCOPIC    EKG: None  Radiology: CT ABDOMEN PELVIS W CONTRAST Result Date: 06/27/2024 CLINICAL DATA:  Nausea emesis EXAM: CT ABDOMEN AND PELVIS WITH CONTRAST TECHNIQUE: Multidetector CT imaging of the abdomen and pelvis was performed using the standard protocol following bolus administration of intravenous contrast. RADIATION DOSE REDUCTION: This exam was performed according to the departmental dose-optimization program which includes automated exposure control, adjustment of the mA and/or kV according to patient size and/or use of iterative reconstruction technique. CONTRAST:  75mL OMNIPAQUE  IOHEXOL  350 MG/ML SOLN COMPARISON:  CT 03/24/2024 FINDINGS: Lower chest: Lung bases demonstrate no acute airspace disease. Hepatobiliary: Multiple gallstones. No biliary dilatation. No focal hepatic abnormality Pancreas: Unremarkable. No pancreatic ductal dilatation or surrounding inflammatory changes. Spleen: Normal in size without focal abnormality. Adrenals/Urinary Tract: Adrenal  glands are within normal limits. Kidneys show no hydronephrosis. The bladder is unremarkable. Stomach/Bowel: Moderate fluid distension of the stomach. Multiple dilated fluid-filled loops of proximal and mid small bowel measuring up to 4.4 cm with similar pattern of obstruction compared with 03/24/2024. Transition point best seen on coronal images within the anterior upper pelvis, series 6, image 60, and axial series 3, image 53. Small bowel distal to the transition is completely decompressed. No acute bowel wall thickening. Negative for intramural air. Moderate stool in the colon Vascular/Lymphatic: No significant vascular findings are  present. No enlarged abdominal or pelvic lymph nodes. Reproductive: Hysterectomy.  No suspicious adnexal mass Other: Negative for pelvic effusion or free air Musculoskeletal: No acute or suspicious osseous abnormality IMPRESSION: 1. Findings consistent with high-grade small bowel obstruction with transition point in the anterior upper pelvis with similar configuration compared to the examination from May. No evidence for perforation or abnormal bowel wall thickening. 2. Gallstones. Electronically Signed   By: Luke Bun M.D.   On: 06/27/2024 23:55     Procedures   Medications Ordered in the ED  sodium chloride  0.9 % bolus 1,000 mL (1,000 mLs Intravenous New Bag/Given 06/27/24 2325)  ondansetron  (ZOFRAN ) injection 4 mg (4 mg Intravenous Given 06/27/24 2326)  famotidine  (PEPCID ) IVPB 20 mg premix (20 mg Intravenous New Bag/Given 06/27/24 2316)  iohexol  (OMNIPAQUE ) 350 MG/ML injection 75 mL (75 mLs Intravenous Contrast Given 06/27/24 2337)                                    Medical Decision Making Amount and/or Complexity of Data Reviewed Labs: ordered. Radiology: ordered.  Risk Prescription drug management. Decision regarding hospitalization.   Initial Impression and Ddx 63 year old well-appearing female presenting for abdominal pain.  Exam notable for generalized abdominal tenderness and abdominal distention.  DDx includes bowel obstruction, appendicitis acute cholecystitis, kidney stone, pyelonephritis, diverticulitis, other. Patient PMH that increases complexity of ED encounter:  recent bowel obstruction  Interpretation of Diagnostics - I independent reviewed and interpreted the labs as followed: Mildly elevated total bilirubin  - I independently visualized the following imaging with scope of interpretation limited to determining acute life threatening conditions related to emergency care: CT ab/pelvis, which revealed findings concerning for small bowel obstruction  Patient  Reassessment and Ultimate Disposition/Management Workup suggestive of small bowel obstruction.  Discussed patient with Dr. Lyndel who advised NG tube placement given the distention of her bowel and admission to family medicine.  Admitted to family medicine service with Dr. Suzann Daring.  She is hemodynamically stable at this time and in no acute distress.  She did refuse pain medicine.  Patient management required discussion with the following services or consulting groups:  Hospitalist Service and General/Trauma Surgery  Complexity of Problems Addressed Acute complicated illness or Injury  Additional Data Reviewed and Analyzed Further history obtained from: Past medical history and medications listed in the EMR and Prior ED visit notes  Patient Encounter Risk Assessment Consideration of hospitalization      Final diagnoses:  Small bowel obstruction [facility])    ED Discharge Orders     None         Lang Norleen POUR, PA-C 06/28/24 CATHRINE Midge Golas, MD 06/28/24 [phone]

## 2024-06-28 NOTE — Consult Note (Signed)
 Consulting Physician: Deward PARAS Roni Friberg  Referring Provider: Norleen Essex, PA-C  Chief Complaint: Abdominal pain, nausea, vomiting  Reason for Consult: Bowel obstruction   Subjective   HPI: Madison Booker is an 63 y.o. female who is here for a small bowel obstruction.  She was admitted for small bowel obstruction back in May.  She got better with nasogastric tube decompression and a small bowel follow-through protocol.  She was pulling weeds in the yard on Sunday and feels like she overdid it triggering these recurrent symptoms.  She has had nausea vomiting and abdominal pain.  Past Medical History:  Diagnosis Date   Abnormal CT scan of lung 01/25/2023   H/O abdominal hysterectomy 11/30/1991   SBO (small bowel obstruction) (HCC) 01/26/2023   Toenail fungus 10/19/2022    Past Surgical History:  Procedure Laterality Date   ABDOMINAL HYSTERECTOMY  1993   BREAST BIOPSY Right 04/02/2022   CESAREAN SECTION  1992    Family History  Problem Relation Age of Onset   Diabetes Sister        3 sisters   Diabetes gravidarum Sister        3 sisters   Hyperlipidemia Sister        3 sisters   Kidney disease Sister        1 sister   Breast cancer Maternal Aunt 12 - 16    Social:  reports that she has never smoked. She has never used smokeless tobacco. She reports that she does not drink alcohol and does not use drugs.  Allergies:  Allergies  Allergen Reactions   Beef-Derived Drug Products    Pork-Derived Products     Medications: No current outpatient medications  ROS - all of the below systems have been reviewed with the patient and positives are indicated with bold text General: chills, fever or night sweats Eyes: blurry vision or double vision ENT: epistaxis or sore throat Allergy/Immunology: itchy/watery eyes or nasal congestion Hematologic/Lymphatic: bleeding problems, blood clots or swollen lymph nodes Endocrine: temperature intolerance or unexpected weight  changes Breast: new or changing breast lumps or nipple discharge Resp: cough, shortness of breath, or wheezing CV: chest pain or dyspnea on exertion GI: as per HPI GU: dysuria, trouble voiding, or hematuria MSK: joint pain or joint stiffness Neuro: TIA or stroke symptoms Derm: pruritus and skin lesion changes Psych: anxiety and depression  Objective   PE Blood pressure 129/60, pulse (!) 114, temperature 98.8 F (37.1 C), temperature source Oral, resp. rate 16, height 5' 5.5 (1.664 m), weight 68 kg, SpO2 100%. Constitutional: NAD; conversant; no deformities Eyes: Moist conjunctiva; no lid lag; anicteric; PERRL Neck: Trachea midline; no thyromegaly Lungs: Normal respiratory effort; no tactile fremitus CV: RRR; no palpable thrills; no pitting edema GI: Abd moderate distention, mild to moderate tenderness diffusely, no point tenderness,; no palpable hepatosplenomegaly MSK: Normal range of motion of extremities; no clubbing/cyanosis Psychiatric: Appropriate affect; alert and oriented x3 Lymphatic: No palpable cervical or axillary lymphadenopathy  Results for orders placed or performed during the hospital encounter of 06/27/24 (from the past 24 hours)  Lipase, blood     Status: None   Collection Time: 06/27/24  3:09 PM  Result Value Ref Range   Lipase 20 11 - 51 U/L  Comprehensive metabolic panel     Status: Abnormal   Collection Time: 06/27/24  3:09 PM  Result Value Ref Range   Sodium 137 135 - 145 mmol/L   Potassium 4.1 3.5 - 5.1 mmol/L  Chloride 95 (L) 98 - 111 mmol/L   CO2 28 22 - 32 mmol/L   Glucose, Bld 121 (H) 70 - 99 mg/dL   BUN 19 8 - 23 mg/dL   Creatinine, Ser 9.18 0.44 - 1.00 mg/dL   Calcium 9.7 8.9 - 89.6 mg/dL   Total Protein 7.7 6.5 - 8.1 g/dL   Albumin 3.9 3.5 - 5.0 g/dL   AST 24 15 - 41 U/L   ALT 25 0 - 44 U/L   Alkaline Phosphatase 80 38 - 126 U/L   Total Bilirubin 1.4 (H) 0.0 - 1.2 mg/dL   GFR, Estimated >39 >39 mL/min   Anion gap 14 5 - 15  CBC      Status: None   Collection Time: 06/27/24  3:09 PM  Result Value Ref Range   WBC 9.2 4.0 - 10.5 K/uL   RBC 4.75 3.87 - 5.11 MIL/uL   Hemoglobin 14.9 12.0 - 15.0 g/dL   HCT 54.1 63.9 - 53.9 %   MCV 96.4 80.0 - 100.0 fL   MCH 31.4 26.0 - 34.0 pg   MCHC 32.5 30.0 - 36.0 g/dL   RDW 87.4 88.4 - 84.4 %   Platelets 237 150 - 400 K/uL   nRBC 0.0 0.0 - 0.2 %     Imaging Orders         CT ABDOMEN PELVIS W CONTRAST         DG Abd Portable 1V-Small Bowel Protocol-Position Verification         DG Abd Portable 1V-Small Bowel Obstruction Protocol-initial, 8 hr delay      Assessment and Plan   Madison Booker is an 63 y.o. female with a recurrent small bowel obstruction.  I recommend nasogastric tube decompression.  We discussed recurrent bowel obstructions can be a reason to proceed to surgery in attempts to lyse any problematic bands of scar tissue.  At this time she is not interested in surgery and hopeful that she will feel better with just NG tube decompression.  The surgery team will continue to follow closely.  If she decompresses well overnight we may decide to start the small bowel obstruction protocol in the morning.    ICD-10-CM   1. Small bowel obstruction (HCC)  K56.609 DG Abd Portable 1V-Small Bowel Obstruction Protocol-initial, 8 hr delay    DG Abd Portable 1V-Small Bowel Obstruction Protocol-initial, 8 hr delay       Deward JINNY Foy, MD  [facility] Use [email or web address] to contact on call provider  New Patient Billing: [phone] - High MDM

## 2024-06-28 NOTE — Plan of Care (Signed)

## 2024-06-29 ENCOUNTER — Inpatient Hospital Stay (HOSPITAL_COMMUNITY)

## 2024-06-29 DIAGNOSIS — K56609 Unspecified intestinal obstruction, unspecified as to partial versus complete obstruction: Secondary | ICD-10-CM | POA: Diagnosis not present

## 2024-06-29 MED ORDER — LACTATED RINGERS IV SOLN: INTRAVENOUS | Status: AC

## 2024-06-29 MED ORDER — ORAL CARE MOUTH RINSE
15.0000 mL | OROMUCOSAL | Status: DC | PRN
Start: 2024-06-29 — End: 2024-07-02

## 2024-06-29 NOTE — Progress Notes (Signed)
     Daily Progress Note Intern Pager: [phone]  Patient name: Madison Booker Medical record number: [id number] Date of birth: [birth year] Age: 63 y.o. Gender: female  Primary Care Provider: Anders Otto DASEN, MD Consultants: General Surgery Code Status: FULL  Pt Overview and Major Events to Date:  8/6: Admitted 8/7: BM  Assessment and Plan: And is a 63 y.o. female presenting with small bowel obstruction.  Medical history includes several prior abdominal surgeries, suspect adhesions causing SBO.  Patient has been hospitalized twice prior for SBO in the last 2 years.  Patient is hoping to utilize conservative management and avoid the operating room. 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 Patient at higher risk for SBO following previous hospitalizations for SBO in 01/2023 and 03/2024.  Admit to MedSurg under Dr. Delores   General surgery consulted, appreciate their guidance. NGT for bowel decompression  NPO   IV fluid resuscitation with LR 122mL/h Reordered fluids overnight Q4h vitals Daily BMP IV Compazine  10 mg Q6H PRN for nausea Avoiding opiates to prevent further ileus, consider NSAIDs for pain control; IV Toradol  15 mg PRN for pain onboard Trend KUB daily  General surgery began SBO protocol and gave patient Gastrografin  8/6. Gen surg follow-up x-ray to ensure contrast past in her colon, and will discontinue NGT and advance her diet.    FEN/GI: NPO, NGT to LIWS, IVF PPx: SCDs, Lovenox  Dispo:Home pending clinical improvement . Barriers include SBO.   Subjective:  Patient reports she had a formed bowel movement at 730 this morning.  She says she is feeling much better, however still having some generalized abdominal discomfort.  She is denying any pain, or localized tenderness.  Objective: Temp:  [98.4 F (36.9 C)-98.9 F (37.2 C)] 98.5 F (36.9 C) (08/07 0607) Pulse  Rate:  [59-62] 60 (08/07 0607) Resp:  [17-18] 18 (08/07 0607) BP: (125-129)/(51-59) 127/51 (08/07 0607) SpO2:  [97 %-99 %] 97 % (08/07 9392) Physical Exam: General: Sitting up in chair, NAD Cardiovascular: Regular rate and rhythm, no rubs/murmur/gallop Respiratory: Normal work of breathing on room air, CTAB Abdomen: Soft, mildly distended, generalized right-sided discomfort  Laboratory: Most recent CBC Lab Results  Component Value Date   WBC 9.2 06/27/2024   HGB 14.9 06/27/2024   HCT 45.8 06/27/2024   MCV 96.4 06/27/2024   PLT 237 06/27/2024   Most recent BMP    Latest Ref Rng & Units 06/28/2024    7:14 AM  BMP  Glucose 70 - 99 mg/dL 892   BUN 8 - 23 mg/dL 19   Creatinine 9.55 - 1.00 mg/dL 9.45   Sodium 864 - 854 mmol/L 134   Potassium 3.5 - 5.1 mmol/L 3.8   Chloride 98 - 111 mmol/L 100   CO2 22 - 32 mmol/L 27   Calcium 8.9 - 10.3 mg/dL 9.0     Idelle Nakai, DO 06/29/2024, 7:42 AM  PGY-1, [facility] FPTS Intern pager: [phone], text pages welcome Secure chat group [facility] Teaching Service

## 2024-06-29 NOTE — Progress Notes (Signed)
 Subjective: CC: Patient reports her abdominal pain has improved with some mild R sided abdominal pain remaining. No nausea or bloating though reports gurgling like she is going to have a bm. No flatus. Formed BM this AM. NGT w/ 500cc/24 hours. 8 hour delay xray last night with persistent dilated small bowel loops with scattered contrast in small bowel. Xray pending this AM.   Objective: Vital signs in last 24 hours: Temp:  [98.4 F (36.9 C)-98.9 F (37.2 C)] 98.5 F (36.9 C) (08/07 0607) Pulse Rate:  [59-62] 59 (08/07 0814) Resp:  [17-18] 17 (08/07 0814) BP: (114-129)/(49-59) 114/49 (08/07 0814) SpO2:  [97 %-99 %] 99 % (08/07 0814) Last BM Date : 06/26/24  Intake/Output from previous day: 08/06 0701 - 08/07 0700 In: 2639.6 [I.V.:2639.6] Out: 500 [Emesis/NG output:500] Intake/Output this shift: No intake/output data recorded.  PE: Gen:  Alert, NAD, pleasant Abd: Soft, mild and improved distension, mild ttp on the right side without rigidity or guarding. + BS. NGT in place on LIWS  Lab Results:  Recent Labs    06/27/24 1509  WBC 9.2  HGB 14.9  HCT 45.8  PLT 237   BMET Recent Labs    06/27/24 1509 06/28/24 0714  NA 137 134*  K 4.1 3.8  CL 95* 100  CO2 28 27  GLUCOSE 121* 107*  BUN 19 19  CREATININE 0.81 0.54  CALCIUM 9.7 9.0   PT/INR No results for input(s): LABPROT, INR in the last 72 hours. CMP     Component Value Date/Time   NA 134 (L) 06/28/2024 0714   NA 138 02/04/2024 0855   K 3.8 06/28/2024 0714   CL 100 06/28/2024 0714   CO2 27 06/28/2024 0714   GLUCOSE 107 (H) 06/28/2024 0714   BUN 19 06/28/2024 0714   BUN 6 (L) 02/04/2024 0855   CREATININE 0.54 06/28/2024 0714   CALCIUM 9.0 06/28/2024 0714   PROT 6.4 (L) 06/28/2024 0714   PROT 6.7 02/04/2024 0855   ALBUMIN 3.1 (L) 06/28/2024 0714   ALBUMIN 4.1 02/04/2024 0855   AST 20 06/28/2024 0714   ALT 20 06/28/2024 0714   ALKPHOS 64 06/28/2024 0714   BILITOT 1.1 06/28/2024 0714    BILITOT 0.8 02/04/2024 0855   GFRNONAA >60 06/28/2024 0714   GFRAA 116 10/25/2020 0858   Lipase     Component Value Date/Time   LIPASE 20 06/27/2024 1509    Studies/Results: DG Abd Portable 1V-Small Bowel Obstruction Protocol-initial, 8 hr delay Result Date: 06/28/2024 CLINICAL DATA:  Small bowel obstruction, 8 hour delay EXAM: PORTABLE ABDOMEN - 1 VIEW COMPARISON:  06/28/2024, CT 06/27/2024 FINDINGS: Right upper quadrant gallstones. Contrast collection in the fundus. Scattered contrast material within the small bowel, persistent distended small bowel loops measuring up to 4.1 cm. No definitive contrast within the colon. Enteric tube tip looped in the proximal stomach. IMPRESSION: 1. Persistent distended small bowel loops with scattered contrast material within the small bowel and larger contrast collection in the fundus. No definitive contrast within the colon. Findings consistent with small bowel obstruction. 2. Cholelithiasis. Electronically Signed   By: Luke Bun M.D.   On: 06/28/2024 19:43   DG Abd Portable 1V-Small Bowel Protocol-Position Verification Result Date: 06/28/2024 CLINICAL DATA:  [id number] Encounter for imaging study to confirm nasogastric (NG) tube placement [id number] EXAM: PORTABLE ABDOMEN - 1 VIEW COMPARISON:  X-ray abdomen 03/25/2024, CT abdomen pelvis 06/27/2024 FINDINGS: Lower abdomen and pelvis collimated off view. Enteric tube courses below the hemidiaphragm  with tip and side port overlying the expected region of the gastric lumen. Excessive tubing noted. Persistent gaseous dilatation of several loops of small bowel visualized within the upper abdomen. Previously administered intravenous contrast noted to be excreted from bilateral collecting systems. No radio-opaque calculi or other significant radiographic abnormality are seen. IMPRESSION: 1. Enteric tube in good position.  Consider retracting by 5 cm. 2. Small-bowel obstruction. Electronically Signed   By: Morgane  Naveau M.D.    On: 06/28/2024 03:54   CT ABDOMEN PELVIS W CONTRAST Result Date: 06/27/2024 CLINICAL DATA:  Nausea emesis EXAM: CT ABDOMEN AND PELVIS WITH CONTRAST TECHNIQUE: Multidetector CT imaging of the abdomen and pelvis was performed using the standard protocol following bolus administration of intravenous contrast. RADIATION DOSE REDUCTION: This exam was performed according to the departmental dose-optimization program which includes automated exposure control, adjustment of the mA and/or kV according to patient size and/or use of iterative reconstruction technique. CONTRAST:  75mL OMNIPAQUE  IOHEXOL  350 MG/ML SOLN COMPARISON:  CT 03/24/2024 FINDINGS: Lower chest: Lung bases demonstrate no acute airspace disease. Hepatobiliary: Multiple gallstones. No biliary dilatation. No focal hepatic abnormality Pancreas: Unremarkable. No pancreatic ductal dilatation or surrounding inflammatory changes. Spleen: Normal in size without focal abnormality. Adrenals/Urinary Tract: Adrenal glands are within normal limits. Kidneys show no hydronephrosis. The bladder is unremarkable. Stomach/Bowel: Moderate fluid distension of the stomach. Multiple dilated fluid-filled loops of proximal and mid small bowel measuring up to 4.4 cm with similar pattern of obstruction compared with 03/24/2024. Transition point best seen on coronal images within the anterior upper pelvis, series 6, image 60, and axial series 3, image 53. Small bowel distal to the transition is completely decompressed. No acute bowel wall thickening. Negative for intramural air. Moderate stool in the colon Vascular/Lymphatic: No significant vascular findings are present. No enlarged abdominal or pelvic lymph nodes. Reproductive: Hysterectomy.  No suspicious adnexal mass Other: Negative for pelvic effusion or free air Musculoskeletal: No acute or suspicious osseous abnormality IMPRESSION: 1. Findings consistent with high-grade small bowel obstruction with transition point in the  anterior upper pelvis with similar configuration compared to the examination from May. No evidence for perforation or abnormal bowel wall thickening. 2. Gallstones. Electronically Signed   By: Luke Bun M.D.   On: 06/27/2024 23:55    Anti-infectives: Anti-infectives (From admission, onward)    None        Assessment/Plan SBO - CT w/ SBO w/ transition in the anterior upper pelvis with similar configuration compared to the examination from May.  - Hx of open R oophorectomy for torsion, C-section and abdominal hysterectomy. She reports this  - HDS without fever, tachycardia or systolic hypotension. No peritonitis on exam. WBC wnl on last check. No current indication for emergency surgery - Keep K >=4, Phos >= 3, Mg >= 2 and mobilize for bowel function. Okay to clamp NGT for mobilization - Patient with return of bowel function. Would like to see xray this AM to ensure contrast has passed in her colon since she still has mild ttp on exam. If it has, will d/c NGT and advance her diet.  - We will follow with you.  FEN - NPO, NGT to LIWS, IVF per primary  VTE - SCDs, Arixtra  ID - None   I reviewed nursing notes, hospitalist notes, last 24 h vitals and pain scores, last 48 h intake and output, last 24 h labs and trends, and last 24 h imaging results.   LOS: 1 day    Gurshan Settlemire M Cristan Hout, PA-C  [facility] 06/29/2024, 9:49 AM Please see Amion for pager number during day hours 7:00am-4:30pm

## 2024-06-29 NOTE — Plan of Care (Signed)

## 2024-06-29 NOTE — Plan of Care (Signed)
  Problem: Education: Goal: Knowledge of General Education information will improve Description: Including pain rating scale, medication(s)/side effects and non-pharmacologic comfort measures Outcome: Progressing   Problem: Activity: Goal: Risk for activity intolerance will decrease Outcome: Progressing   Problem: Nutrition: Goal: Adequate nutrition will be maintained Outcome: Not Progressing   Problem: Elimination: Goal: Will not experience complications related to urinary retention Outcome: Progressing

## 2024-06-29 NOTE — Progress Notes (Signed)
 There was an error in charting for the 2100 vital signs that were taken by Philippe, NT. They were corrected and pt was greens MEWS at that point.

## 2024-06-29 NOTE — Assessment & Plan Note (Addendum)
 Most likely secondary to postoperative adhesions from multiple abdominal surgeries. Patient at higher risk for SBO following previous hospitalizations for SBO in 01/2023 and 03/2024.  Admit to MedSurg under Dr. Delores   General surgery consulted, appreciate their guidance. NGT for bowel decompression  NPO   IV fluid resuscitation with LR 100mL/h Reordered fluids overnight Q4h vitals Daily BMP IV Compazine  10 mg Q6H PRN for nausea Avoiding opiates to prevent further ileus, consider NSAIDs for pain control; IV Toradol  15 mg PRN for pain onboard Trend KUB daily  General surgery began SBO protocol and gave patient Gastrografin  8/6. Gen surg follow-up x-ray to ensure contrast past in her colon, and will discontinue NGT and advance her diet.

## 2024-06-29 NOTE — Progress Notes (Signed)
 Mobility Specialist Progress Note:   06/29/24 1525  Mobility  Activity Ambulated with assistance (In hallway)  Level of Assistance Modified independent, requires aide device or extra time  Assistive Device Other (Comment) (IV Pole)  Distance Ambulated (ft) 775 ft  Activity Response Tolerated well  Mobility Referral Yes  Mobility visit 1 Mobility  Mobility Specialist Start Time (ACUTE ONLY) 1510  Mobility Specialist Stop Time (ACUTE ONLY) 1523  Mobility Specialist Time Calculation (min) (ACUTE ONLY) 13 min   Received pt in chair and agreeable to mobility. No physical assistance needed. No c/o. Returned to room without fault. Left pt in chair with personal belongings and call light within reach. All needs met.  Lavanda Pollack Mobility Specialist  Please contact via [employer] or  Rehab Office [phone]

## 2024-06-30 DIAGNOSIS — K56609 Unspecified intestinal obstruction, unspecified as to partial versus complete obstruction: Secondary | ICD-10-CM | POA: Diagnosis not present

## 2024-06-30 LAB — BASIC METABOLIC PANEL WITH GFR
Anion gap: 8 (ref 5–15)
BUN: 6 mg/dL — ABNORMAL LOW (ref 8–23)
CO2: 28 mmol/L (ref 22–32)
Calcium: 8.3 mg/dL — ABNORMAL LOW (ref 8.9–10.3)
Chloride: 101 mmol/L (ref 98–111)
Creatinine, Ser: 0.51 mg/dL (ref 0.44–1.00)
GFR, Estimated: >60 mL/min (ref >60)
Glucose, Bld: 84 mg/dL (ref 70–99)
Potassium: 3.2 mmol/L — ABNORMAL LOW (ref 3.5–5.1)
Sodium: 137 mmol/L (ref 135–145)

## 2024-06-30 MED ORDER — POTASSIUM CHLORIDE CRYS ER 20 MEQ PO TBCR
40.0000 meq | EXTENDED_RELEASE_TABLET | Freq: Two times a day (BID) | ORAL | Status: AC
  Administered 2024-06-30 (×2): 40 meq via ORAL
  Filled 2024-06-30 (×2): qty 2

## 2024-06-30 NOTE — Progress Notes (Signed)
 Subjective: CC: Tolerating cld without abdominal pain, n/v. Passing flatus. 3 BM's since I saw her yesterday.    Objective: Vital signs in last 24 hours: Temp:  [98.1 F (36.7 C)-98.8 F (37.1 C)] 98.1 F (36.7 C) (08/08 0732) Pulse Rate:  [50-56] 52 (08/08 0732) Resp:  [16-18] 16 (08/08 0732) BP: (110-118)/(47-63) 116/63 (08/08 0732) SpO2:  [98 %-100 %] 100 % (08/08 0732) Last BM Date : 06/29/24  Intake/Output from previous day: 08/07 0701 - 08/08 0700 In: 660 [P.O.:660] Out: 50 [Emesis/NG output:50] Intake/Output this shift: No intake/output data recorded.  PE: Gen:  Alert, NAD, pleasant Abd: Soft, improved mild distension, NT, +BS  Lab Results:  Recent Labs    06/27/24 1509  WBC 9.2  HGB 14.9  HCT 45.8  PLT 237   BMET Recent Labs    06/28/24 0714 06/30/24 0644  NA 134* 137  K 3.8 3.2*  CL 100 101  CO2 27 28  GLUCOSE 107* 84  BUN 19 6*  CREATININE 0.54 0.51  CALCIUM 9.0 8.3*   PT/INR No results for input(s): LABPROT, INR in the last 72 hours. CMP     Component Value Date/Time   NA 137 06/30/2024 0644   NA 138 02/04/2024 0855   K 3.2 (L) 06/30/2024 0644   CL 101 06/30/2024 0644   CO2 28 06/30/2024 0644   GLUCOSE 84 06/30/2024 0644   BUN 6 (L) 06/30/2024 0644   BUN 6 (L) 02/04/2024 0855   CREATININE 0.51 06/30/2024 0644   CALCIUM 8.3 (L) 06/30/2024 0644   PROT 6.4 (L) 06/28/2024 0714   PROT 6.7 02/04/2024 0855   ALBUMIN 3.1 (L) 06/28/2024 0714   ALBUMIN 4.1 02/04/2024 0855   AST 20 06/28/2024 0714   ALT 20 06/28/2024 0714   ALKPHOS 64 06/28/2024 0714   BILITOT 1.1 06/28/2024 0714   BILITOT 0.8 02/04/2024 0855   GFRNONAA >60 06/30/2024 0644   GFRAA 116 10/25/2020 0858   Lipase     Component Value Date/Time   LIPASE 20 06/27/2024 1509    Studies/Results: DG Abd Portable 1V Result Date: 06/29/2024 CLINICAL DATA:  Small bowel obstruction. EXAM: PORTABLE ABDOMEN - 1 VIEW COMPARISON:  Radiograph yesterday FINDINGS: Enteric  contrast is seen throughout the entire colon. Persistent but diminishing gaseous small bowel distension centrally. Enteric tube remains in place. IMPRESSION: Enteric contrast throughout the entire colon. Persistent but diminishing gaseous small bowel distension centrally. Findings may represent partial or resolving small bowel obstruction. Electronically Signed   By: Andrea Gasman M.D.   On: 06/29/2024 14:17   DG Abd Portable 1V-Small Bowel Obstruction Protocol-initial, 8 hr delay Result Date: 06/28/2024 CLINICAL DATA:  Small bowel obstruction, 8 hour delay EXAM: PORTABLE ABDOMEN - 1 VIEW COMPARISON:  06/28/2024, CT 06/27/2024 FINDINGS: Right upper quadrant gallstones. Contrast collection in the fundus. Scattered contrast material within the small bowel, persistent distended small bowel loops measuring up to 4.1 cm. No definitive contrast within the colon. Enteric tube tip looped in the proximal stomach. IMPRESSION: 1. Persistent distended small bowel loops with scattered contrast material within the small bowel and larger contrast collection in the fundus. No definitive contrast within the colon. Findings consistent with small bowel obstruction. 2. Cholelithiasis. Electronically Signed   By: Luke Bun M.D.   On: 06/28/2024 19:43    Anti-infectives: Anti-infectives (From admission, onward)    None        Assessment/Plan SBO - Clinically and radiographically resolving. Contrast in colon on xray 8/7.  She reports resolution of abdominal pain. She is tolerating cld without n/v and has return of bowel function. Non-tender on exam. Will advance diet as tolerated. If tolerates diet advancement, okay for discharge from our standpoint.   FEN - FLD, ADAT to Soft.  VTE - SCDs, Arixtra  ID - None   I reviewed nursing notes, hospitalist notes, last 24 h vitals and pain scores, last 48 h intake and output, last 24 h labs and trends, and last 24 h imaging results.   LOS: 2 days    Ozell CHRISTELLA Shaper,  [facility] 06/30/2024, 9:11 AM Please see Amion for pager number during day hours 7:00am-4:30pm

## 2024-06-30 NOTE — Plan of Care (Signed)

## 2024-06-30 NOTE — Plan of Care (Signed)
 Went to evaluate how the patient is doing since advancing her diet to opaque liquids.  She says she is tolerating this okay, but it is taking a much longer time for her to bounce back than when she was previously admitted for her SBO's.  She is denying any nausea, vomiting or pain when eating, however endorses feeling bloated.  Will reevaluate this tomorrow morning, with hopes to advance her diet to solids.

## 2024-06-30 NOTE — Progress Notes (Signed)
     Daily Progress Note Intern Pager: [phone]  Patient name: Madison Booker Medical record number: [id number] Date of birth: 12-10-1960 Age: 63 y.o. Gender: female  Primary Care Provider: Anders Otto DASEN, MD Consultants: General Surgery Code Status: FULL  Pt Overview and Major Events to Date:  8/6: Admitted 8/7: BM (4 total), contrast in colon on x-ray 8/8: Tolerating clear liquid diet   Assessment and Plan: Patient is a 63 y.o. female presenting with small bowel obstruction.  Medical history includes several prior abdominal surgeries, suspect adhesions causing SBO.  Patient has been hospitalized twice prior for SBO in the last 2 years.  Patient is hoping to utilize conservative management and avoid the operating room.   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 Patient at higher risk for SBO following previous hospitalizations for SBO in 01/2023 and 03/2024.  Admit to MedSurg under Dr. Delores    Q4h vitals Daily BMP IV Compazine  10 mg Q6H PRN for nausea Avoiding opiates to prevent further ileus, consider NSAIDs for pain control; IV Toradol  15 mg PRN for pain onboard Trend KUB daily  General surgery began SBO protocol and gave patient Gastrografin  8/6. Gen surg discontinued NGT yesterday and advance diet to clear liquid diet. Patient has tolerated clear liquid diet, denies nausea and vomiting We will slowly advance her to solid diet, and if she is able to tolerate this, plan to discharge today.    FEN/GI: FLD, ADAT to Soft.  PPx: SCDs, walking around the unit Dispo:Home today. Barriers include ability to tolerate solid diet.   Subjective:  Patient is doing well today and has tolerated her clear liquid diet with no issue.  Patient says she had 3 bowel movements yesterday, and is passing gas.  Says she walked around the unit many times last night and is putting mild on her  socks.  Objective: Temp:  [98.1 F (36.7 C)-98.8 F (37.1 C)] 98.1 F (36.7 C) (08/08 0732) Pulse Rate:  [50-56] 52 (08/08 0732) Resp:  [16-18] 16 (08/08 0732) BP: (110-118)/(47-63) 116/63 (08/08 0732) SpO2:  [98 %-100 %] 100 % (08/08 0732) Physical Exam: General: Sitting up in chair, NAD Cardiovascular: RRR no rub/murmur/gallop Respiratory: CTAB Abdomen: Soft, mildly distended, nonguarded and nontender to light and deep palpation Extremities: Nonedematous, perfused  Laboratory: Most recent CBC Lab Results  Component Value Date   WBC 9.2 06/27/2024   HGB 14.9 06/27/2024   HCT 45.8 06/27/2024   MCV 96.4 06/27/2024   PLT 237 06/27/2024   Most recent BMP    Latest Ref Rng & Units 06/30/2024    6:44 AM  BMP  Glucose 70 - 99 mg/dL 84   BUN 8 - 23 mg/dL 6   Creatinine 9.55 - 8.99 mg/dL 9.48   Sodium 864 - 854 mmol/L 137   Potassium 3.5 - 5.1 mmol/L 3.2   Chloride 98 - 111 mmol/L 101   CO2 22 - 32 mmol/L 28   Calcium 8.9 - 10.3 mg/dL 8.3     Idelle Nakai, DO 06/30/2024, 8:57 AM  PGY-1, [facility] FPTS Intern pager: [phone], text pages welcome Secure chat group [facility] Teaching Service

## 2024-06-30 NOTE — Assessment & Plan Note (Addendum)
 Most likely secondary to postoperative adhesions from multiple abdominal surgeries. Patient at higher risk for SBO following previous hospitalizations for SBO in 01/2023 and 03/2024.  Admit to MedSurg under Dr. Delores    Q4h vitals Daily BMP IV Compazine  10 mg Q6H PRN for nausea Avoiding opiates to prevent further ileus, consider NSAIDs for pain control; IV Toradol  15 mg PRN for pain onboard Trend KUB daily  General surgery began SBO protocol and gave patient Gastrografin  8/6. Gen surg discontinued NGT yesterday and advance diet to clear liquid diet. Patient has tolerated clear liquid diet, denies nausea and vomiting We will slowly advance her to solid diet, and if she is able to tolerate this, plan to discharge today.

## 2024-06-30 NOTE — Discharge Instructions (Addendum)
 Dear Romero VEAR Decree,   Thank you for letting us  participate in your care!  You were admitted for small bowel obstruction.  This was managed conservatively without need for surgical intervention. Get help right away if: You have pain or cramps that get worse. You vomit blood. You feel like you may vomit and the feeling lasts a long time. You cannot stop vomiting. You cannot drink fluids. You feel confused. You feel very thirsty (dehydrated). Your belly gets more bloated. You feel weak or you faint.  POST-HOSPITAL & CARE INSTRUCTIONS We recommend following up with your PCP within 1 week from being discharged from the hospital. Please let PCP/Specialists know of any changes in medications that were made which you will be able to see in the medications section of this packet.  DOCTOR'S APPOINTMENTS & FOLLOW UP Future Appointments  Date Time Provider Department Center  07/06/2024  9:30 AM Lorrane Pac, MD [facility]  07/13/2024  8:00 AM El-Khouri, Evalene MATSU, RD [facility]     Thank you for choosing [facility]! Take care and be well!  Family Medicine Teaching Service Inpatient Team [place]  [facility]  [address] [place], [place] [address] [phone]

## 2024-06-30 NOTE — Discharge Summary (Incomplete)
 Family Medicine Teaching [facility] Discharge Summary  Patient name: Madison Booker Medical record number: [id number] Date of birth: Apr 18, 1961 Age: 63 y.o. Gender: female Date of Admission: 06/27/2024  Date of Discharge: 06/30/2024 Admitting Physician: Suzann CHRISTELLA Daring, MD  Primary Care Provider: Anders Otto DASEN, MD Consultants: General Surgery  Indication for Hospitalization: SBO  Discharge Diagnoses/Problem List:  Principal Problem for Admission: SBO Other Problems addressed during stay:  Active Problems:   Chronic health problem   SBO (small bowel obstruction) (HCC)   The above problem list has been updated and reviewed for accuracy, including the initial reason for admission.   Brief Hospital Course:  Madison Booker is a 63yo F w/PMHx of SBO (01/2023 and 03/2024), abdominal hysterectomy, and R oophorectomy who was admitted for small bowel obstruction. Her hospital course is outlined below:   Small bowel obstruction 2 prior in 01/2023 and 03/2024, respectively. Presented with abdominal pain and constipation x2 days. CT A&P was confirmatory for high grade small bowel obstruction. General surgery was consulted and recommended NGT for bowel decompression.  General surgery then initiated SBO protocol, and Gastrografin  was given.  Patient has since reported 4 well-formed bowel movements and is passing gas without discomfort.  Contrast was found throughout her colon and NG tube was removed.  Patient's diet was changed from n.p.o. to clear liquids, and she has been able to tolerate this well, then advance to solids, with good tolerability clearly ready for discharge.  Patient does not take any home medications.   PCP Follow-up recommendations: Consider general surgery referral for postoperative adhesion lysis surgery.  Consider outpatient colonoscopy.    Results/Tests Pending at Time of Discharge:  Unresulted Labs (From admission, onward)     Start     Ordered    06/27/24 1455  Urinalysis, Routine w reflex microscopic -Urine, Clean Catch  Once,   URGENT       Question:  Specimen Source  Answer:  Urine, Clean Catch   06/27/24 1454             Disposition: Home  Discharge Condition: Stable  Discharge Exam:  Vitals:   06/30/24 0657 06/30/24 0732  BP: (!) 118/47 116/63  Pulse: (!) 51 (!) 52  Resp: 18 16  Temp: 98.4 F (36.9 C) 98.1 F (36.7 C)  SpO2: 100% 100%    Significant Procedures: None  Significant Labs and Imaging:  No results for input(s): WBC, HGB, HCT, PLT in the last 48 hours. Recent Labs  Lab 06/30/24 0644  NA 137  K 3.2*  CL 101  CO2 28  GLUCOSE 84  BUN 6*  CREATININE 0.51  CALCIUM 8.3*    Imaging Study CT Abd and Pelvis with contrast Impression from Radiologist:  1. Findings consistent with high-grade small bowel obstruction with transition point in the anterior upper pelvis with similar configuration compared to the examination from May. No evidence for perforation or abnormal bowel wall thickening. 2. Gallstones   Discharge Medications:  Allergies as of 06/30/2024       Reactions   Beef-derived Drug Products    Pork-derived Products      Med Rec must be completed prior to using this [facility]***       Discharge Instructions: Please refer to Patient Instructions section of EMR for full details.  Patient was counseled important signs and symptoms that should prompt return to medical care, changes in medications, dietary instructions, activity restrictions, and follow up appointments.   Follow-Up Appointments:   Idelle Nakai,  DO 06/30/2024, 1:51 PM PGY-1, [facility]

## 2024-07-01 DIAGNOSIS — K56609 Unspecified intestinal obstruction, unspecified as to partial versus complete obstruction: Secondary | ICD-10-CM | POA: Diagnosis not present

## 2024-07-01 LAB — BASIC METABOLIC PANEL WITH GFR
Anion gap: 8 (ref 5–15)
BUN: 6 mg/dL — ABNORMAL LOW (ref 8–23)
CO2: 26 mmol/L (ref 22–32)
Calcium: 8.7 mg/dL — ABNORMAL LOW (ref 8.9–10.3)
Chloride: 103 mmol/L (ref 98–111)
Creatinine, Ser: 0.54 mg/dL (ref 0.44–1.00)
GFR, Estimated: >60 mL/min (ref >60)
Glucose, Bld: 100 mg/dL — ABNORMAL HIGH (ref 70–99)
Potassium: 3.8 mmol/L (ref 3.5–5.1)
Sodium: 137 mmol/L (ref 135–145)

## 2024-07-01 NOTE — Plan of Care (Signed)
  Problem: Coping: Goal: Level of anxiety will decrease Outcome: Progressing   Problem: Elimination: Goal: Will not experience complications related to bowel motility Outcome: Progressing   Problem: Pain Managment: Goal: General experience of comfort will improve and/or be controlled Outcome: Progressing

## 2024-07-01 NOTE — Progress Notes (Signed)
     Daily Progress Note Intern Pager: [phone]  Patient name: Madison Booker Medical record number: [id number] Date of birth: [birth year] Age: 63 y.o. Gender: female  Primary Care Provider: Anders Otto DASEN, MD Consultants: Gen surgery Code Status: Full  Pt Overview and Major Events to Date:  8/6 - admitted  Assessment and Plan:  63 year old female with history of recurrent small bowel obstruction presenting with small bowel obstruction.  Did not require surgical intervention, treated conservatively.  Feeling much better today, tolerated soft diet.  Will trial regular diet for lunch and potential discharge later this afternoon if that goes well. 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 Patient at higher risk for SBO following previous hospitalizations for SBO in 01/2023 and 03/2024. Surgery signed off 8/8.  -Advance to regular diet today - Compazine  10 mg as needed for nausea, has not required this since 8/6   FEN/GI: regular diet PPx: none, pt ambulating frequently around unit Dispo:Home pending clinical improvement .   Subjective:  Doing well today, tolerated soft diet for breakfast.  Ready to try to advance and potentially go home later  Objective: Temp:  [98.1 F (36.7 C)-98.3 F (36.8 C)] 98.3 F (36.8 C) (08/09 0807) Pulse Rate:  [53-60] 60 (08/09 0807) Resp:  [16-17] 17 (08/09 0807) BP: (99-112)/(38-53) 99/38 (08/09 0807) SpO2:  [100 %] 100 % (08/09 9192) Physical Exam: General: well appearing, NAD Respiratory: normal work of breathing on RA Abdomen: Soft, non-tender, non-distended Extremities: Mild swelling bilateral ankles  Laboratory: Most recent CBC Lab Results  Component Value Date   WBC 9.2 06/27/2024   HGB 14.9 06/27/2024   HCT 45.8 06/27/2024   MCV 96.4 06/27/2024   PLT 237 06/27/2024   Most recent BMP    Latest Ref Rng & Units 06/30/2024    6:44  AM  BMP  Glucose 70 - 99 mg/dL 84   BUN 8 - 23 mg/dL 6   Creatinine 9.55 - 8.99 mg/dL 9.48   Sodium 864 - 854 mmol/L 137   Potassium 3.5 - 5.1 mmol/L 3.2   Chloride 98 - 111 mmol/L 101   CO2 22 - 32 mmol/L 28   Calcium 8.9 - 10.3 mg/dL 8.3     Eliya Bubar, DO 07/01/2024, 8:09 AM  PGY-2, [facility] FPTS Intern pager: [phone], text pages welcome Secure chat group [facility] Teaching Service

## 2024-07-01 NOTE — Progress Notes (Signed)
 Patient has been discharged per MD order. IV removed, tolerated well. AVS instructions reviewed with patient, verbalized understanding.

## 2024-07-01 NOTE — Discharge Summary (Signed)
   Family Medicine Teaching [facility] Discharge Summary  Patient name: Madison Booker Medical record number: [id number] Date of birth: 1960-12-03 Age: 63 y.o. Gender: female Date of Admission: 06/27/2024  Date of Discharge: 07/01/24 Admitting Physician: Suzann CHRISTELLA Daring, MD  Primary Care Provider: Anders Otto DASEN, MD Consultants: General surgery  Indication for Hospitalization: small bowel obstruction  Discharge Diagnoses/Problem List:  Principal Problem for Admission: SBO Other Problems addressed during stay:  Active Problems:   Chronic health problem   SBO (small bowel obstruction) [facility])  Brief Hospital Course:  Madison Deshotel. Booker is a 63yo F w/PMHx of SBO (01/2023 and 03/2024), abdominal hysterectomy, and R oophorectomy who was admitted for small bowel obstruction. Her hospital course is outlined below:   Small bowel obstruction 2 prior SBO in 01/2023 and 03/2024, respectively. Presented with abdominal pain and constipation x2 days. CT A&P was confirmatory for high grade small bowel obstruction. General surgery was consulted and recommended NGT for bowel decompression.  General surgery then initiated SBO protocol, and Gastrografin  was given. NG tube was removed on 8/7 when XR verified contrast in colon. At the time of discharge, pt was tolerating a regular diet and having bowel movements.   Patient does not take any home medications.   PCP Follow-up recommendations: Consider general surgery referral for postoperative adhesion lysis surgery.  Consider outpatient colonoscopy.   Disposition: home  Discharge Condition: stable  Discharge Exam:  Vitals:   07/01/24 0807 07/01/24 1500  BP: (!) 99/38 112/68  Pulse: 60 70  Resp: 17 18  Temp: 98.3 F (36.8 C) 98.1 F (36.7 C)  SpO2: 100% 100%   General: well appearing, NAD Cardiovascular: RRR, no m/r/g Respiratory: normal work of breathing on RA, CTAB Abdomen: Normal bowel sounds, soft, non-tender  Significant  Procedures: NGT placed for bowel decompression  Significant Labs and Imaging:  No results for input(s): WBC, HGB, HCT, PLT in the last 48 hours. Recent Labs  Lab 06/30/24 0644 07/01/24 1139  NA 137 137  K 3.2* 3.8  CL 101 103  CO2 28 26  GLUCOSE 84 100*  BUN 6* 6*  CREATININE 0.51 0.54  CALCIUM 8.3* 8.7*   06/27/24 CT A/P W Contrast IMPRESSION: 1. Findings consistent with high-grade small bowel obstruction with transition point in the anterior upper pelvis with similar configuration compared to the examination from May. No evidence for perforation or abnormal bowel wall thickening. 2. Gallstones.   06/29/24 XR Abdomen IMPRESSION: Enteric contrast throughout the entire colon. Persistent but diminishing gaseous small bowel distension centrally. Findings may represent partial or resolving small bowel obstruction.    Discharge Medications:  Allergies as of 07/01/2024       Reactions   Beef-derived Drug Products    Pork-derived Products         Medication List    You have not been prescribed any medications.     Discharge Instructions: Please refer to Patient Instructions section of EMR for full details.  Patient was counseled important signs and symptoms that should prompt return to medical care, changes in medications, dietary instructions, activity restrictions, and follow up appointments.   Follow-Up Appointments: 07/06/24 Dr. Lorrane Prescott, [place], DO 07/01/2024, 5:53 PM PGY-2, [facility]

## 2024-07-01 NOTE — Assessment & Plan Note (Addendum)
 Most likely secondary to postoperative adhesions from multiple abdominal surgeries. Patient at higher risk for SBO following previous hospitalizations for SBO in 01/2023 and 03/2024. Surgery signed off 8/8.  -Advance to regular diet today - Compazine  10 mg as needed for nausea, has not required this since 8/6

## 2024-07-03 ENCOUNTER — Telehealth: Payer: Self-pay

## 2024-07-03 NOTE — Transitions of Care (Post Inpatient/ED Visit) (Signed)
   07/03/2024  Name: Madison Booker MRN: [id number] DOB: 11-29-1960  Today's TOC FU Call Status: Today's TOC FU Call Status:: Successful TOC FU Call Completed TOC FU Call Complete Date: 07/03/24 Patient's Name and Date of Birth confirmed.  Transition Care Management Follow-up Telephone Call Date of Discharge: 07/01/24 Discharge Facility: [facility]) Type of Discharge: Inpatient Admission Primary Inpatient Discharge Diagnosis:: intestinal obstruction How have you been since you were released from the hospital?: Better Any questions or concerns?: No  Items Reviewed: Did you receive and understand the discharge instructions provided?: Yes Medications obtained,verified, and reconciled?: Yes (Medications Reviewed) Any new allergies since your discharge?: No Dietary orders reviewed?: Yes Do you have support at home?: Yes People in Home [RPT]: spouse  Medications Reviewed Today: Medications Reviewed Today   Medications were not reviewed in this encounter     Home Care and Equipment/Supplies: Were Home Health Services Ordered?: NA Any new equipment or medical supplies ordered?: NA  Functional Questionnaire: Do you need assistance with bathing/showering or dressing?: No Do you need assistance with meal preparation?: No Do you need assistance with eating?: No Do you have difficulty maintaining continence: No Do you need assistance with getting out of bed/getting out of a chair/moving?: No Do you have difficulty managing or taking your medications?: No  Follow up appointments reviewed: PCP Follow-up appointment confirmed?: Yes Date of PCP follow-up appointment?: 07/06/24 [facility] Follow-up appointment confirmed?: NA Do you need transportation to your follow-up appointment?: No Do you understand care options if your condition(s) worsen?: Yes-patient verbalized understanding    SIGNATURE Julian Lemmings, LPN [facility] Nurse Health  Advisor Direct Dial [phone]

## 2024-07-06 ENCOUNTER — Ambulatory Visit: Payer: Self-pay

## 2024-07-06 VITALS — BP 99/60 | HR 62 | Ht 65.0 in | Wt 155.4 lb

## 2024-07-06 DIAGNOSIS — Z09 Encounter for follow-up examination after completed treatment for conditions other than malignant neoplasm: Secondary | ICD-10-CM | POA: Diagnosis not present

## 2024-07-06 DIAGNOSIS — Z8719 Personal history of other diseases of the digestive system: Secondary | ICD-10-CM

## 2024-07-06 NOTE — Progress Notes (Signed)
     SUBJECTIVE:   CHIEF COMPLAINT / HPI:   Madison Booker presents today for hospital follow up.   Hospitalized at [facility] from 06/27/2024 to 07/01/2024, for SBO.  Since discharge, patient reports she is feeling well and has no acute complaints.  She had 2 other previous hospitalizations for SBO, and reports that she is slowly figuring it out.  Desires low intervention treatment at this time.  Reports that unless they can promise 100% that it will treat the SBO then I am not interested.  Was far more open to considering the colonoscopy.  Denies any history of fevers, chills, nausea, vomiting, constipation, diarrhea, chest pain, shortness of breath, leg swelling, or abdominal pain.   PERTINENT  PMH / PSH: Total hysterectomy, right oophorectomy, cesarean section  OBJECTIVE:   BP 99/60   Pulse 62   Ht 5' 5 (1.651 m)   Wt 155 lb 6.4 oz (70.5 kg)   SpO2 99%   BMI 25.86 kg/m    Cardiac: Regular rate and rhythm. Normal S1/S2. No murmurs, rubs, or gallops appreciated. Lungs: Clear bilaterally to ascultation.  Abdomen: Normoactive bowel sounds. No tenderness to deep or light palpation. No rebound or guarding.   Extremities: No edema bilaterally, no redness, calves nontender Psych: Pleasant and appropriate    ASSESSMENT/PLAN:   Assessment & Plan Hospital discharge follow-up Patient doing well, with no complaints, symptoms, or concerns today. Extensive counseling given regarding etiology of her symptoms and red flags or signs to look out for indicative of hospitalization.  She follows with a nutritionist, and reports that has helped.  - Not interested in general surgery referral for adhesion lysis surgery -Will discuss colonoscopy at follow-up visit, wanted time to recover. - Follow-up in 4 to 6 months    Fairy Amy, MD [facility]

## 2024-07-06 NOTE — Patient Instructions (Addendum)
 Thank you for visiting the clinic today, it was good to see you!  Please always bring your medication bottles  In today's visit we discussed:  Small bowel obstruction: Avoid processed foods and foods with high protein, follow the counsel of the nutritionist. We will consider a colonoscopy at follow up visit. Stay very hydrated.  Nutrition: I recommend continued counseling with the nutritionist.   Please follow-up in 4-6 months  For any questions, please call the office at [phone] or send me a message in MyChart. Have a great day!  -Fairy Amy, MD  [facility] Resident, PGY-1

## 2024-07-13 ENCOUNTER — Ambulatory Visit: Admitting: Dietician

## 2024-07-28 ENCOUNTER — Encounter: Payer: Self-pay | Admitting: Family Medicine

## 2024-08-24 ENCOUNTER — Ambulatory Visit: Admitting: Dietician

## 2024-10-03 ENCOUNTER — Ambulatory Visit: Admitting: Dietician

## 2024-10-08 ENCOUNTER — Encounter: Payer: Self-pay | Admitting: Family Medicine

## 2024-10-08 DIAGNOSIS — K565 Intestinal adhesions [bands], unspecified as to partial versus complete obstruction: Secondary | ICD-10-CM | POA: Insufficient documentation

## 2024-10-09 ENCOUNTER — Telehealth: Payer: Self-pay | Admitting: Family Medicine

## 2024-10-09 ENCOUNTER — Ambulatory Visit: Admitting: Family Medicine

## 2024-10-09 ENCOUNTER — Encounter: Payer: Self-pay | Admitting: Family Medicine

## 2024-10-09 VITALS — BP 111/53 | HR 66 | Ht 65.0 in | Wt 155.8 lb

## 2024-10-09 DIAGNOSIS — E785 Hyperlipidemia, unspecified: Secondary | ICD-10-CM | POA: Diagnosis not present

## 2024-10-09 DIAGNOSIS — K565 Intestinal adhesions [bands], unspecified as to partial versus complete obstruction: Secondary | ICD-10-CM

## 2024-10-09 NOTE — Assessment & Plan Note (Addendum)
 LDL improved from 161 mg/dL to 895 mg/dL with dietary changes. - Continue current dietary practices to maintain LDL levels. - LDL check in a few month

## 2024-10-09 NOTE — Assessment & Plan Note (Addendum)
 Intermittent obstruction with recent concerning episodes. Managed with dietary modifications and hydration. Prefers non-invasive management. - Continue dietary modifications and hydration. - If obstruction symptoms occur, follow NPO protocol and gradually advance diet. - Seek hospital care if symptoms do not improve with home management.

## 2024-10-09 NOTE — Progress Notes (Signed)
 SUBJECTIVE:   CHIEF COMPLAINT / HPI:   Discussed the use of AI scribe software for clinical note transcription with the patient, who gave verbal consent to proceed.  History of Present Illness   Madison Booker is a 63 year old female who presents for routine follow-up. Hx of two hospitalizations for SBO  She reports having had two episodes of small bowel obstruction recently and has been told she has adhesions. Prior to an obstruction, she experiences a sensation of internal intestinal spasms or squeezing, followed by a period without bowel movements or passing gas, and eventually vomiting. She is cautious with her diet, avoiding raw vegetables, and works with a dietitian to manage her condition. She tracks her water intake using an app, maintaining about 64 ounces per day, and has added yoga to her routine, which she finds helpful. She is cautious about procedures like colonoscopy due to the risk of irritation and obstruction.  She has undergone stool testing for colon cancer screening and is up to date with her colon cancer screening using stool testing. She is not interested in a colonoscopy unless necessary.  She has a history of hysterectomy and does not require contraception. She has designated her spouse as the decision-maker in case she is unable to make healthcare decisions.  She reports receiving a flu shot and COVID vaccine this year and believes she received a pneumonia vaccine last year, although it is not recorded in the database. She is due for a mammogram and MRI next April.  No current pain, intimate partner violence, or concerns about syphilis, gonorrhea, or chlamydia. No current medications.       PERTINENT  PMH / PSH: PMHx reviewed  OBJECTIVE:   BP (!) 111/53   Pulse 66   Ht 5' 5 (1.651 m)   Wt 155 lb 12.8 oz (70.7 kg)   SpO2 98%   BMI 25.93 kg/m   Physical Exam   VITALS: BP- 111/53 HEENT: Left ear normal, tympanic membrane intact, no discharge, no  erythema. Right ear normal, tympanic membrane intact. Teeth well aligned, oropharynx without erythema. NECK: No cervical lymphadenopathy. CHEST: Lungs clear to auscultation bilaterally, no wheezing. CARDIOVASCULAR: Heart regular rate and rhythm, no murmurs. ABDOMEN: Normal bowel sounds, no distention, no tenderness. EXTREMITIES: No edema, normal peripheral pulses. NEUROLOGICAL: Sensation intact in upper and lower limbs. Good motor strength. Reflexes normal.     Results   LABS HIV: Negative (03/2024) Hepatitis C: Negative Hepatitis B: Negative (2024) LDL: 104 (01/2024)  RADIOLOGY Bilateral screening mammogram: Recommended in one year (02/2024) Bilateral breast MRI: Recommended in one year if high risk (02/2024)       ASSESSMENT/PLAN:   Assessment & Plan Hyperlipidemia, unspecified hyperlipidemia type LDL improved from 161 mg/dL to 895 mg/dL with dietary changes. - Continue current dietary practices to maintain LDL levels. - LDL check in a few month Intermittent small bowel obstruction due to adhesions [facility]) Intermittent obstruction with recent concerning episodes. Managed with dietary modifications and hydration. Prefers non-invasive management. - Continue dietary modifications and hydration. - If obstruction symptoms occur, follow NPO protocol and gradually advance diet. - Seek hospital care if symptoms do not improve with home management.   Health maintenance update A routine wellness visit was completed in March of 2025   Declined colonoscopy due to intestinal adhesions.  Breast cancer screening due next year. Hepatitis B titers are declining. Pneumonia vaccination status uncertain. We reviewed record and checked NCIR data base with no documentation of PCV20 - Continue current diet  and hydration practices. - Monitor Cologuard cancer screening results; consider colonoscopy if stool testing is positive. - Schedule breast cancer screening with a mammogram and/or MRI next  year. - Consider hepatitis B vaccination in the future. - Confirm pneumonia vaccination status with [facility] or schedule a nurse appointment for vaccination.  More than 50% of this 30 mins visit was spent on record review, counseling regarding SBO and care coordination.   Otto Fairly, MD [facility]

## 2024-10-09 NOTE — Telephone Encounter (Signed)
 Contacted the [facility] pharmacy to check and see if the patient received a Prevnar 20 vaccine; Pharmacy staff stated that the patient did not receive a Prevnar vaccine at there location.

## 2024-10-09 NOTE — Patient Instructions (Signed)
Pneumococcal Conjugate Vaccine (PCV20) Injection What is this medication? PNEUMOCOCCAL CONJUGATE VACCINE (NEU mo KOK al kon ju gate vak SEEN) reduces the risk of pneumococcal disease, such as pneumonia. It does not treat pneumococcal disease. It is still possible to get pneumococcal disease after receiving this vaccine, but the symptoms may be less severe or not last as long. It works by helping your immune system learn how to fight off a future infection. This medicine may be used for other purposes; ask your health care provider or pharmacist if you have questions. COMMON BRAND NAME(S): Prevnar 20 What should I tell my care team before I take this medication? They need to know if you have any of these conditions: Bleeding disorder Fever Immune system problems An unusual or allergic reaction to pneumococcal vaccine, diphtheria toxoid, other vaccines, other medications, foods, dyes, or preservatives Pregnant or trying to get pregnant Breastfeeding How should I use this medication? This vaccine is injected into a muscle. It is given by your care team. A copy of Vaccine Information Statements will be given before each vaccination. Be sure to read this information carefully each time. This sheet may change often. Talk to your care team about the use of this medication in children. While it may be given to children as young as 6 weeks for selected conditions, precautions do apply. Overdosage: If you think you have taken too much of this medicine contact a poison control center or emergency room at once. NOTE: This medicine is only for you. Do not share this medicine with others. What if I miss a dose? This does not apply. This medication is not for regular use. What may interact with this medication? Medications for cancer chemotherapy Medications that suppress your immune function Steroid medications, such as prednisone or cortisone This list may not describe all possible interactions. Give  your health care provider a list of all the medicines, herbs, non-prescription drugs, or dietary supplements you use. Also tell them if you smoke, drink alcohol, or use illegal drugs. Some items may interact with your medicine. What should I watch for while using this medication? Visit your care team regularly. Report any side effects to your care team right away. This vaccine, like all vaccines, may not fully protect everyone. What side effects may I notice from receiving this medication? Side effects that you should report to your care team as soon as possible: Allergic reactions--skin rash, itching, hives, swelling of the face, lips, tongue, or throat Side effects that usually do not require medical attention (report these to your care team if they continue or are bothersome): Fatigue Fever Headache Joint pain Muscle pain Pain, redness, or irritation at injection site This list may not describe all possible side effects. Call your doctor for medical advice about side effects. You may report side effects to FDA at 1-800-FDA-1088. Where should I keep my medication? This vaccine is only given by your care team. It will not be stored at home. NOTE: This sheet is a summary. It may not cover all possible information. If you have questions about this medicine, talk to your doctor, pharmacist, or health care provider.  2024 Elsevier/Gold Standard (2022-04-22 00:00:00)

## 2024-10-31 ENCOUNTER — Telehealth: Payer: Self-pay | Admitting: Family Medicine

## 2024-10-31 NOTE — Telephone Encounter (Signed)
 After hours call  Patient calls reporting episode of blood on the toilet paper whenever she wiped today after stooling.  She denies pain.  She does have a history of SBO though she has not been constipated.  She has been having otherwise regular stools.  No blood mixed in with the stool.  No dizziness, heart fluttering, shortness of breath.  She has an appointment already at the office tomorrow to get her pneumonia vaccine.  I have switched this from a nurse visit to a visit with Dr. Diona to get her shot and further discuss.  Feels it is most likely due to her history of hemorrhoids.  She will let me know if bleeding is worsening or she starts to feel dizzy or short of breath overnight before her appointment in the morning.

## 2024-11-01 ENCOUNTER — Ambulatory Visit: Payer: Self-pay

## 2024-11-01 ENCOUNTER — Ambulatory Visit

## 2024-11-01 VITALS — BP 119/66 | HR 53 | Ht 65.0 in | Wt 154.2 lb

## 2024-11-01 DIAGNOSIS — Z23 Encounter for immunization: Secondary | ICD-10-CM | POA: Diagnosis not present

## 2024-11-01 DIAGNOSIS — K625 Hemorrhage of anus and rectum: Secondary | ICD-10-CM

## 2024-11-01 NOTE — Progress Notes (Signed)
° ° °  SUBJECTIVE:   CHIEF COMPLAINT / HPI:   Bright red blood per rectum Reports one episode of BM yesterday with bright red blood on toilet paper when wiping. No bloody or black stool. No abdominal pain. Patient reports noticing something ballooning out of bottom yesterday, gently pushed this bulge back in, has felt better since. Last had another normal BM prior to visit today. No fever or other sick symptoms. Tolerating PO normally.   PERTINENT  PMH / PSH: SBO  OBJECTIVE:   BP 119/66   Pulse (!) 53   Ht 5' 5 (1.651 m)   Wt 154 lb 3.2 oz (69.9 kg)   SpO2 99%   BMI 25.66 kg/m   General: Well-appearing. Resting comfortably in room. Pulm: Breathing comfortably on room air. No increased WOB. Skin:  Warm, dry. Psych: Pleasant and appropriate.  GU: External anus without fissure or lesions. DRE exam performed without visible blood. Small ~1cm, smooth protrusion palpated within R side of rectum. GU exam chaperoned by CMA.    ASSESSMENT/PLAN:   Assessment & Plan BRBPR (bright red blood per rectum) Possible hemorrhoid noted on exam, with history suggestive of possible hemorrhoid as well. Discussed supportive hemorrhoid care. Last Cologuard in 01/2022 which was negative. Last CBC with normal Hgb in Aug 2025. Given age and history of questionable history of GIB, discussed and ordered colonoscopy.  Encounter for immunization Received PCV-20 vaccine during visit today.    PCP follow up as needed.   Damien Cassis, MD [facility]

## 2024-11-01 NOTE — Patient Instructions (Addendum)
 Thank you for visiting clinic today and allowing us  to participate in your care!  You likely have a hemorrhoid. Please use an over the coutner hemorrhoid cream to help with symptoms. We have also ordered a colonoscopy for you. Someone will contact you over the next few weeks to schedule this.   Please see your PCP as needed.   Reach out any time with any questions or concerns you may have - we are here for you!  Damien Cassis, MD [facility] [phone]
